# Patient Record
Sex: Female | Born: 1955
Health system: Southern US, Community
[De-identification: ages and names within clinical notes are randomized; demographics above are authoritative.]

## PROBLEM LIST (undated history)

## (undated) DIAGNOSIS — F32A Depression, unspecified: Secondary | ICD-10-CM

## (undated) DIAGNOSIS — F329 Major depressive disorder, single episode, unspecified: Secondary | ICD-10-CM

## (undated) HISTORY — DX: Major depressive disorder, single episode, unspecified: F32.9

## (undated) HISTORY — DX: Depression, unspecified: F32.A

## (undated) HISTORY — PX: OTHER SURGICAL HISTORY: SHX169

## (undated) HISTORY — PX: SPINE SURGERY: SHX786

---

## 1986-12-14 HISTORY — PX: TUBAL LIGATION: SHX77

## 2017-01-21 ENCOUNTER — Encounter: Payer: Self-pay | Admitting: Family

## 2017-01-21 ENCOUNTER — Ambulatory Visit (INDEPENDENT_AMBULATORY_CARE_PROVIDER_SITE_OTHER): Payer: Self-pay | Admitting: Family

## 2017-01-21 DIAGNOSIS — F329 Major depressive disorder, single episode, unspecified: Secondary | ICD-10-CM

## 2017-01-21 DIAGNOSIS — F418 Other specified anxiety disorders: Secondary | ICD-10-CM

## 2017-01-21 DIAGNOSIS — F32A Depression, unspecified: Secondary | ICD-10-CM

## 2017-01-21 DIAGNOSIS — E663 Overweight: Secondary | ICD-10-CM | POA: Insufficient documentation

## 2017-01-21 DIAGNOSIS — F419 Anxiety disorder, unspecified: Principal | ICD-10-CM

## 2017-01-21 MED ORDER — CITALOPRAM HYDROBROMIDE 20 MG PO TABS: 20.0000 mg | ORAL_TABLET | Freq: Every day | ORAL | 1 refills | Status: DC

## 2017-01-21 NOTE — Progress Notes (Signed)
   Subjective:    Patient ID: Catherine [name], female    DOB: February 10, 1956, 61 y.o.   MRN: [id number]  PT presents to the office today to establish care. Pt has a history of depression and has been on Celexa in past. Pt states she moved and was "happy" and stopped taking her medication. Since he has had a job change and is very stressed.  Depression         This is a recurrent problem.  The problem has been rapidly worsening since onset.  Associated symptoms include fatigue, helplessness, hopelessness, irritable, restlessness, decreased interest and sad.     The symptoms are aggravated by work stress and family issues.  Past treatments include nothing.     Review of Systems  Constitutional: Positive for fatigue.  Psychiatric/Behavioral: Positive for depression.  All other systems reviewed and are negative.      Objective:   Physical Exam  Constitutional: She is oriented to person, place, and time. She appears well-developed and well-nourished. She is irritable. No distress.  HENT:  Head: Normocephalic and atraumatic.  Right Ear: External ear normal.  Left Ear: External ear normal.  Nose: Nose normal.  Mouth/Throat: Oropharynx is clear and moist.  Eyes: Pupils are equal, round, and reactive to light.  Neck: Normal range of motion. Neck supple. No thyromegaly present.  Cardiovascular: Normal rate, regular rhythm, normal heart sounds and intact distal pulses.   No murmur heard. Pulmonary/Chest: Effort normal and breath sounds normal. No respiratory distress. She has no wheezes.  Abdominal: Soft. Bowel sounds are normal. She exhibits no distension. There is no tenderness.  Musculoskeletal: Normal range of motion. She exhibits no edema or tenderness.  Neurological: She is alert and oriented to person, place, and time.  Skin: Skin is warm and dry.  Psychiatric: She has a normal mood and affect. Her behavior is normal. Judgment and thought content normal.  Pt tearful and crying  Vitals  reviewed.     BP 138/86   Pulse 77   Temp 97.8 F (36.6 C) (Oral)   Ht 5\' 9"  (1.753 m)   Wt 196 lb (88.9 kg)   BMI 28.94 kg/m      Assessment & Plan:  1. Anxiety and depression -Pt started on Celexa 20 mg today Stress management discussed RTO in 6 week s - citalopram (CELEXA) 20 MG tablet; Take 1 tablet (20 mg total) by mouth daily.  Dispense: 90 tablet; Refill: 1  2. Overweight (BMI 25.0-29.9)    Evelina Dun, FNP

## 2017-01-21 NOTE — Patient Instructions (Signed)
Stress and Stress Management Stress is a normal reaction to life events. It is what you feel when life demands more than you are used to or more than you can handle. Some stress can be useful. For example, the stress reaction can help you catch the last bus of the day, study for a test, or meet a deadline at work. But stress that occurs too often or for too long can cause problems. It can affect your emotional health and interfere with relationships and normal daily activities. Too much stress can weaken your immune system and increase your risk for physical illness. If you already have a medical problem, stress can make it worse. What are the causes? All sorts of life events may cause stress. An event that causes stress for one person may not be stressful for another person. Major life events commonly cause stress. These may be positive or negative. Examples include losing your job, moving into a new home, getting married, having a baby, or losing a loved one. Less obvious life events may also cause stress, especially if they occur day after day or in combination. Examples include working long hours, driving in traffic, caring for children, being in debt, or being in a difficult relationship. What are the signs or symptoms? Stress may cause emotional symptoms including, the following:  Anxiety. This is feeling worried, afraid, on edge, overwhelmed, or out of control.  Anger. This is feeling irritated or impatient.  Depression. This is feeling sad, down, helpless, or guilty.  Difficulty focusing, remembering, or making decisions. Stress may cause physical symptoms, including the following:  Aches and pains. These may affect your head, neck, back, stomach, or other areas of your body.  Tight muscles or clenched jaw.  Low energy or trouble sleeping. Stress may cause unhealthy behaviors, including the following:  Eating to feel better (overeating) or skipping meals.  Sleeping too little, too  much, or both.  Working too much or putting off tasks (procrastination).  Smoking, drinking alcohol, or using drugs to feel better. How is this diagnosed? Stress is diagnosed through an assessment by your health care provider. Your health care provider will ask questions about your symptoms and any stressful life events.Your health care provider will also ask about your medical history and may order blood tests or other tests. Certain medical conditions and medicine can cause physical symptoms similar to stress. Mental illness can cause emotional symptoms and unhealthy behaviors similar to stress. Your health care provider may refer you to a mental health professional for further evaluation. How is this treated? Stress management is the recommended treatment for stress.The goals of stress management are reducing stressful life events and coping with stress in healthy ways. Techniques for reducing stressful life events include the following:  Stress identification. Self-monitor for stress and identify what causes stress for you. These skills may help you to avoid some stressful events.  Time management. Set your priorities, keep a calendar of events, and learn to say "no." These tools can help you avoid making too many commitments. Techniques for coping with stress include the following:  Rethinking the problem. Try to think realistically about stressful events rather than ignoring them or overreacting. Try to find the positives in a stressful situation rather than focusing on the negatives.  Exercise. Physical exercise can release both physical and emotional tension. The key is to find a form of exercise you enjoy and do it regularly.  Relaxation techniques. These relax the body and mind. Examples include yoga,  meditation, tai chi, biofeedback, deep breathing, progressive muscle relaxation, listening to music, being out in nature, journaling, and other hobbies. Again, the key is to find one or  more that you enjoy and can do regularly.  Healthy lifestyle. Eat a balanced diet, get plenty of sleep, and do not smoke. Avoid using alcohol or drugs to relax.  Strong support network. Spend time with family, friends, or other people you enjoy being around.Express your feelings and talk things over with someone you trust. Counseling or talktherapy with a mental health professional may be helpful if you are having difficulty managing stress on your own. Medicine is typically not recommended for the treatment of stress.Talk to your health care provider if you think you need medicine for symptoms of stress. Follow these instructions at home:  Keep all follow-up visits as directed by your health care provider.  Take all medicines as directed by your health care provider. Contact a health care provider if:  Your symptoms get worse or you start having new symptoms.  You feel overwhelmed by your problems and can no longer manage them on your own. Get help right away if:  You feel like hurting yourself or someone else. This information is not intended to replace advice given to you by your health care provider. Make sure you discuss any questions you have with your health care provider. Document Released: 05/26/2001 Document Revised: 05/07/2016 Document Reviewed: 07/25/2013 Elsevier Interactive Patient Education  2017 [employer].

## 2017-02-24 ENCOUNTER — Ambulatory Visit (INDEPENDENT_AMBULATORY_CARE_PROVIDER_SITE_OTHER): Payer: Self-pay | Admitting: Family Medicine

## 2017-02-24 ENCOUNTER — Encounter: Payer: Self-pay | Admitting: Family Medicine

## 2017-02-24 VITALS — BP 138/74 | HR 63 | Temp 97.5°F | Ht 69.0 in | Wt 195.1 lb

## 2017-02-24 DIAGNOSIS — J0110 Acute frontal sinusitis, unspecified: Secondary | ICD-10-CM

## 2017-02-24 MED ORDER — AZITHROMYCIN 250 MG PO TABS: ORAL_TABLET | ORAL | 0 refills | Status: DC

## 2017-02-24 NOTE — Progress Notes (Signed)
BP 138/74   Pulse 63   Temp 97.5 F (36.4 C) (Oral)   Ht 5\' 9"  (1.753 m)   Wt 195 lb 2 oz (88.5 kg)   BMI 28.81 kg/m    Subjective:    Patient ID: Catherine [name], female    DOB: 1956-06-21, 61 y.o.   MRN: [id number]  HPI: Catherine [name] is a 61 y.o. female presenting on 02/24/2017 for Sinusitis (sinus congestion, sore throat, left ear pain; began 1 week ago; has tried Dayquil, Nyquil and Sudafed) and Cough   HPI Sinus congestion and sore throat and cough Patient has been having sinus congestion and sore throat and cough that has been going on for the past 6 days. She also has developed some chills overnight and some left ear pain over the past couple days. She has been using DayQuil and NyQuil and just last night started using Sudafed. She denies any sick contacts that she knows of. She denies any shortness of breath or wheezing. She does not feel like it's worsening or improving over the past 6 days.  Relevant past medical, surgical, family and social history reviewed and updated as indicated. Interim medical history since our last visit reviewed. Allergies and medications reviewed and updated.  Review of Systems  Constitutional: Positive for chills. Negative for fever.  HENT: Positive for congestion, ear pain, postnasal drip, rhinorrhea, sinus pressure, sneezing and sore throat. Negative for ear discharge.   Eyes: Negative for pain, redness and visual disturbance.  Respiratory: Positive for cough. Negative for chest tightness, shortness of breath and wheezing.   Cardiovascular: Negative for chest pain and leg swelling.  Genitourinary: Negative for difficulty urinating and dysuria.  Musculoskeletal: Negative for back pain and gait problem.  Skin: Negative for rash.  Neurological: Negative for light-headedness and headaches.  Psychiatric/Behavioral: Negative for agitation and behavioral problems.  All other systems reviewed and are negative.   Per HPI unless specifically indicated  above        Objective:    BP 138/74   Pulse 63   Temp 97.5 F (36.4 C) (Oral)   Ht 5\' 9"  (1.753 m)   Wt 195 lb 2 oz (88.5 kg)   BMI 28.81 kg/m   Wt Readings from Last 3 Encounters:  02/24/17 195 lb 2 oz (88.5 kg)  01/21/17 196 lb (88.9 kg)    Physical Exam  Constitutional: She is oriented to person, place, and time. She appears well-developed and well-nourished. No distress.  HENT:  Right Ear: Tympanic membrane, external ear and ear canal normal.  Left Ear: Tympanic membrane, external ear and ear canal normal.  Nose: Mucosal edema and rhinorrhea present. No epistaxis. Right sinus exhibits frontal sinus tenderness. Right sinus exhibits no maxillary sinus tenderness. Left sinus exhibits frontal sinus tenderness. Left sinus exhibits no maxillary sinus tenderness.  Mouth/Throat: Uvula is midline and mucous membranes are normal. Posterior oropharyngeal edema and posterior oropharyngeal erythema present. No oropharyngeal exudate or tonsillar abscesses.  Eyes: Conjunctivae are normal.  Neck: Neck supple.  Cardiovascular: Normal rate, regular rhythm, normal heart sounds and intact distal pulses.   No murmur heard. Pulmonary/Chest: Effort normal and breath sounds normal. No respiratory distress. She has no wheezes. She has no rales.  Musculoskeletal: Normal range of motion. She exhibits no edema or tenderness.  Lymphadenopathy:    She has no cervical adenopathy.  Neurological: She is alert and oriented to person, place, and time. Coordination normal.  Skin: Skin is warm and dry. No rash noted. She is not  diaphoretic.  Psychiatric: She has a normal mood and affect. Her behavior is normal.  Vitals reviewed.   No results found for this or any previous visit.    Assessment & Plan:   Problem List Items Addressed This Visit    None    Visit Diagnoses    Acute non-recurrent frontal sinusitis    -  Primary   Recommended for patient to use Flonase and Mucinex and an antihistamine and  nasal saline   Relevant Medications   azithromycin (ZITHROMAX) 250 MG tablet       Follow up plan: Return if symptoms worsen or fail to improve.  Counseling provided for all of the vaccine components No orders of the defined types were placed in this encounter.   Caryl Pina, MD [facility] 02/24/2017, 12:04 PM

## 2017-03-04 ENCOUNTER — Ambulatory Visit (INDEPENDENT_AMBULATORY_CARE_PROVIDER_SITE_OTHER): Payer: Self-pay | Admitting: Family

## 2017-03-04 ENCOUNTER — Encounter: Payer: Self-pay | Admitting: Family

## 2017-03-04 VITALS — BP 134/87 | HR 60 | Temp 97.2°F | Ht 69.0 in | Wt 194.4 lb

## 2017-03-04 DIAGNOSIS — F32A Depression, unspecified: Secondary | ICD-10-CM

## 2017-03-04 DIAGNOSIS — F329 Major depressive disorder, single episode, unspecified: Secondary | ICD-10-CM

## 2017-03-04 DIAGNOSIS — F418 Other specified anxiety disorders: Secondary | ICD-10-CM

## 2017-03-04 DIAGNOSIS — F419 Anxiety disorder, unspecified: Principal | ICD-10-CM

## 2017-03-04 MED ORDER — CITALOPRAM HYDROBROMIDE 40 MG PO TABS: 40.0000 mg | ORAL_TABLET | Freq: Every day | ORAL | 5 refills | Status: DC

## 2017-03-04 NOTE — Patient Instructions (Signed)
Major Depressive Disorder, Adult Major depressive disorder (MDD) is a mental health condition. It may also be called clinical depression or unipolar depression. MDD usually causes feelings of sadness, hopelessness, or helplessness. MDD can also cause physical symptoms. It can interfere with work, school, relationships, and other everyday activities. MDD may be mild, moderate, or severe. It may occur once (single episode major depressive disorder) or it may occur multiple times (recurrent major depressive disorder). What are the causes? The exact cause of this condition is not known. MDD is most likely caused by a combination of things, which may include:  Genetic factors. These are traits that are passed along from parent to child.  Individual factors. Your personality, your behavior, and the way you handle your thoughts and feelings may contribute to MDD. This includes personality traits and behaviors learned from others.  Physical factors, such as: ? Differences in the part of your brain that controls emotion. This part of your brain may be different than it is in people who do not have MDD. ? Long-term (chronic) medical or psychiatric illnesses.  Social factors. Traumatic experiences or major life changes may play a role in the development of MDD.  What increases the risk? This condition is more likely to develop in women. The following factors may also make you more likely to develop MDD:  A family history of depression.  Troubled family relationships.  Abnormally low levels of certain brain chemicals.  Traumatic events in childhood, especially abuse or the loss of a parent.  Being under a lot of stress, or long-term stress, especially from upsetting life experiences or losses.  A history of: ? Chronic physical illness. ? Other mental health disorders. ? Substance abuse.  Poor living conditions.  Experiencing social exclusion or discrimination on a regular basis.  What are  the signs or symptoms? The main symptoms of MDD typically include:  Constant depressed or irritable mood.  Loss of interest in things and activities.  MDD symptoms may also include:  Sleeping or eating too much or too little.  Unexplained weight change.  Fatigue or low energy.  Feelings of worthlessness or guilt.  Difficulty thinking clearly or making decisions.  Thoughts of suicide or of harming others.  Physical agitation or weakness.  Isolation.  Severe cases of MDD may also occur with other symptoms, such as:  Delusions or hallucinations, in which you imagine things that are not real (psychotic depression).  Low-level depression that lasts at least a year (chronic depression or persistent depressive disorder).  Extreme sadness and hopelessness (melancholic depression).  Trouble speaking and moving (catatonic depression).  How is this diagnosed? This condition may be diagnosed based on:  Your symptoms.  Your medical history, including your mental health history. This may involve tests to evaluate your mental health. You may be asked questions about your lifestyle, including any drug and alcohol use, and how long you have had symptoms of MDD.  A physical exam.  Blood tests to rule out other conditions.  You must have a depressed mood and at least four other MDD symptoms most of the day, nearly every day in the same 2-week timeframe before your health care provider can confirm a diagnosis of MDD. How is this treated? This condition is usually treated by mental health professionals, such as psychologists, psychiatrists, and clinical social workers. You may need more than one type of treatment. Treatment may include:  Psychotherapy. This is also called talk therapy or counseling. Types of psychotherapy include: ? Cognitive behavioral   therapy (CBT). This type of therapy teaches you to recognize unhealthy feelings, thoughts, and behaviors, and replace them with  positive thoughts and actions. ? Interpersonal therapy (IPT). This helps you to improve the way you relate to and communicate with others. ? Family therapy. This treatment includes members of your family.  Medicine to treat anxiety and depression, or to help you control certain emotions and behaviors.  Lifestyle changes, such as: ? Limiting alcohol and drug use. ? Exercising regularly. ? Getting plenty of sleep. ? Making healthy eating choices. ? Spending more time outdoors.  Treatments involving stimulation of the brain can be used in situations with extremely severe symptoms, or when medicine or other therapies do not work over time. These treatments include electroconvulsive therapy, transcranial magnetic stimulation, and vagal nerve stimulation. Follow these instructions at home: Activity  Return to your normal activities as told by your health care provider.  Exercise regularly and spend time outdoors as told by your health care provider. General instructions  Take over-the-counter and prescription medicines only as told by your health care provider.  Do not drink alcohol. If you drink alcohol, limit your alcohol intake to no more than 1 drink a day for nonpregnant women and 2 drinks a day for men. One drink equals 12 oz of beer, 5 oz of wine, or 1 oz of hard liquor. Alcohol can affect any antidepressant medicines you are taking. Talk to your health care provider about your alcohol use.  Eat a healthy diet and get plenty of sleep.  Find activities that you enjoy doing, and make time to do them.  Consider joining a support group. Your health care provider may be able to recommend a support group.  Keep all follow-up visits as told by your health care provider. This is important. Where to find more information: National Alliance on Mental Illness  www.nami.org  [place]. National Institute of Mental Health  www.nimh.nih.gov  National Suicide Prevention  Lifeline  1-800-273-TALK (8255). This is free, 24-hour help.  Contact a health care provider if:  Your symptoms get worse.  You develop new symptoms. Get help right away if:  You self-harm.  You have serious thoughts about hurting yourself or others.  You see, hear, taste, smell, or feel things that are not present (hallucinate). This information is not intended to replace advice given to you by your health care provider. Make sure you discuss any questions you have with your health care provider. Document Released: 03/27/2013 Document Revised: 08/06/2016 Document Reviewed: 06/10/2016 Elsevier Interactive Patient Education  2017 [employer]  

## 2017-03-04 NOTE — Progress Notes (Signed)
   Subjective:    Patient ID: Catherine [name], female    DOB: Sep 01, 1956, 61 y.o.   MRN: [id number]  Pt presents to the office today to recheck GAD and Depression. Pt was started on Celexa 20 mg. PT reports her anxiety is improved, but still does not feel like she does not want to get off the couch.  Anxiety  Presents for follow-up visit. Symptoms include decreased concentration, depressed mood, excessive worry, insomnia, irritability, nervous/anxious behavior and restlessness. Patient reports no panic or suicidal ideas. Symptoms occur most days. The quality of sleep is fair.    Depression         Associated symptoms include decreased concentration, insomnia, irritable, restlessness and decreased interest.  Associated symptoms include not sad and no suicidal ideas.  Past treatments include SSRIs - Selective serotonin reuptake inhibitors.  Past medical history includes anxiety.       Review of Systems  Constitutional: Positive for irritability.  Psychiatric/Behavioral: Positive for decreased concentration and depression. Negative for suicidal ideas. The patient is nervous/anxious and has insomnia.   All other systems reviewed and are negative.      Objective:   Physical Exam  Constitutional: She is oriented to person, place, and time. She appears well-developed and well-nourished. She is irritable. No distress.  HENT:  Head: Normocephalic.  Eyes: Pupils are equal, round, and reactive to light.  Neck: Normal range of motion. Neck supple. No thyromegaly present.  Cardiovascular: Normal rate, regular rhythm, normal heart sounds and intact distal pulses.   No murmur heard. Pulmonary/Chest: Effort normal and breath sounds normal. No respiratory distress. She has no wheezes.  Abdominal: Soft. Bowel sounds are normal. She exhibits no distension. There is no tenderness.  Musculoskeletal: Normal range of motion. She exhibits no edema or tenderness.  Neurological: She is alert and oriented to  person, place, and time.  Skin: Skin is warm and dry.  Psychiatric: She has a normal mood and affect. Her behavior is normal. Judgment and thought content normal.  Vitals reviewed.    BP 134/87   Pulse 60   Temp 97.2 F (36.2 C) (Oral)   Ht 5\' 9"  (1.753 m)   Wt 194 lb 6.4 oz (88.2 kg)   BMI 28.71 kg/m      Assessment & Plan:  1. Anxiety and depression -Celexa increased to 40 mg from 20 mg Stress management discussed RTO in 6 weeks, discussed importance of scheduling CPE for bloodwork  - citalopram (CELEXA) 40 MG tablet; Take 1 tablet (40 mg total) by mouth daily.  Dispense: 30 tablet; Refill: [place], FNP

## 2017-04-15 ENCOUNTER — Encounter: Payer: Self-pay | Admitting: Family

## 2017-04-15 ENCOUNTER — Ambulatory Visit (INDEPENDENT_AMBULATORY_CARE_PROVIDER_SITE_OTHER): Payer: Self-pay | Admitting: Family

## 2017-04-15 VITALS — BP 124/74 | HR 53 | Temp 97.9°F | Ht 69.0 in | Wt 195.6 lb

## 2017-04-15 DIAGNOSIS — F32A Depression, unspecified: Secondary | ICD-10-CM

## 2017-04-15 DIAGNOSIS — F419 Anxiety disorder, unspecified: Secondary | ICD-10-CM

## 2017-04-15 DIAGNOSIS — F329 Major depressive disorder, single episode, unspecified: Secondary | ICD-10-CM

## 2017-04-15 MED ORDER — CITALOPRAM HYDROBROMIDE 40 MG PO TABS: 40.0000 mg | ORAL_TABLET | Freq: Every day | ORAL | 3 refills | Status: DC

## 2017-04-15 NOTE — Progress Notes (Signed)
   Subjective:    Patient ID: Catherine [name], female    DOB: Apr 22, 1956, 62 y.o.   MRN: [id number]  Pt presents to the office today to recheck GAD. PT currently taking celexa 40 mg daily and reports doing great.  Anxiety  Presents for follow-up visit. Symptoms include excessive worry and nervous/anxious behavior. Patient reports no depressed mood or irritability. Symptoms occur occasionally. The quality of sleep is good.        Review of Systems  Constitutional: Negative for irritability.  Psychiatric/Behavioral: The patient is nervous/anxious.   All other systems reviewed and are negative.      Objective:   Physical Exam  Constitutional: She is oriented to person, place, and time. She appears well-developed and well-nourished. No distress.  HENT:  Head: Normocephalic and atraumatic.  Eyes: Pupils are equal, round, and reactive to light.  Neck: Normal range of motion. Neck supple. No thyromegaly present.  Cardiovascular: Normal rate, regular rhythm, normal heart sounds and intact distal pulses.   No murmur heard. Pulmonary/Chest: Effort normal and breath sounds normal. No respiratory distress. She has no wheezes.  Abdominal: Soft. Bowel sounds are normal. She exhibits no distension. There is no tenderness.  Musculoskeletal: Normal range of motion. She exhibits no edema or tenderness.  Neurological: She is alert and oriented to person, place, and time.  Skin: Skin is warm and dry.  Psychiatric: She has a normal mood and affect. Her behavior is normal. Judgment and thought content normal.  Vitals reviewed.   BP 124/74   Pulse (!) 53   Temp 97.9 F (36.6 C) (Oral)   Ht 5\' 9"  (1.753 m)   Wt 195 lb 9.6 oz (88.7 kg)   BMI 28.89 kg/m        Assessment & Plan:  1. Anxiety and depression -Stress management discuss -Continue Celexa 40 mg  RTO in 1 year - citalopram (CELEXA) 40 MG tablet; Take 1 tablet (40 mg total) by mouth daily.  Dispense: 90 tablet; Refill:  [place], FNP

## 2017-04-15 NOTE — Patient Instructions (Signed)
Stress and Stress Management Stress is a normal reaction to life events. It is what you feel when life demands more than you are used to or more than you can handle. Some stress can be useful. For example, the stress reaction can help you catch the last bus of the day, study for a test, or meet a deadline at work. But stress that occurs too often or for too long can cause problems. It can affect your emotional health and interfere with relationships and normal daily activities. Too much stress can weaken your immune system and increase your risk for physical illness. If you already have a medical problem, stress can make it worse. What are the causes? All sorts of life events may cause stress. An event that causes stress for one person may not be stressful for another person. Major life events commonly cause stress. These may be positive or negative. Examples include losing your job, moving into a new home, getting married, having a baby, or losing a loved one. Less obvious life events may also cause stress, especially if they occur day after day or in combination. Examples include working long hours, driving in traffic, caring for children, being in debt, or being in a difficult relationship. What are the signs or symptoms? Stress may cause emotional symptoms including, the following:  Anxiety. This is feeling worried, afraid, on edge, overwhelmed, or out of control.  Anger. This is feeling irritated or impatient.  Depression. This is feeling sad, down, helpless, or guilty.  Difficulty focusing, remembering, or making decisions. Stress may cause physical symptoms, including the following:  Aches and pains. These may affect your head, neck, back, stomach, or other areas of your body.  Tight muscles or clenched jaw.  Low energy or trouble sleeping. Stress may cause unhealthy behaviors, including the following:  Eating to feel better (overeating) or skipping meals.  Sleeping too little, too  much, or both.  Working too much or putting off tasks (procrastination).  Smoking, drinking alcohol, or using drugs to feel better. How is this diagnosed? Stress is diagnosed through an assessment by your health care provider. Your health care provider will ask questions about your symptoms and any stressful life events.Your health care provider will also ask about your medical history and may order blood tests or other tests. Certain medical conditions and medicine can cause physical symptoms similar to stress. Mental illness can cause emotional symptoms and unhealthy behaviors similar to stress. Your health care provider may refer you to a mental health professional for further evaluation. How is this treated? Stress management is the recommended treatment for stress.The goals of stress management are reducing stressful life events and coping with stress in healthy ways. Techniques for reducing stressful life events include the following:  Stress identification. Self-monitor for stress and identify what causes stress for you. These skills may help you to avoid some stressful events.  Time management. Set your priorities, keep a calendar of events, and learn to say "no." These tools can help you avoid making too many commitments. Techniques for coping with stress include the following:  Rethinking the problem. Try to think realistically about stressful events rather than ignoring them or overreacting. Try to find the positives in a stressful situation rather than focusing on the negatives.  Exercise. Physical exercise can release both physical and emotional tension. The key is to find a form of exercise you enjoy and do it regularly.  Relaxation techniques. These relax the body and mind. Examples include yoga,  meditation, tai chi, biofeedback, deep breathing, progressive muscle relaxation, listening to music, being out in nature, journaling, and other hobbies. Again, the key is to find one or  more that you enjoy and can do regularly.  Healthy lifestyle. Eat a balanced diet, get plenty of sleep, and do not smoke. Avoid using alcohol or drugs to relax.  Strong support network. Spend time with family, friends, or other people you enjoy being around.Express your feelings and talk things over with someone you trust. Counseling or talktherapy with a mental health professional may be helpful if you are having difficulty managing stress on your own. Medicine is typically not recommended for the treatment of stress.Talk to your health care provider if you think you need medicine for symptoms of stress. Follow these instructions at home:  Keep all follow-up visits as directed by your health care provider.  Take all medicines as directed by your health care provider. Contact a health care provider if:  Your symptoms get worse or you start having new symptoms.  You feel overwhelmed by your problems and can no longer manage them on your own. Get help right away if:  You feel like hurting yourself or someone else. This information is not intended to replace advice given to you by your health care provider. Make sure you discuss any questions you have with your health care provider. Document Released: 05/26/2001 Document Revised: 05/07/2016 Document Reviewed: 07/25/2013 Elsevier Interactive Patient Education  2017 [employer].

## 2018-05-03 ENCOUNTER — Telehealth: Payer: Self-pay | Admitting: Family

## 2018-07-12 ENCOUNTER — Ambulatory Visit (INDEPENDENT_AMBULATORY_CARE_PROVIDER_SITE_OTHER): Payer: Self-pay | Admitting: Family

## 2018-07-12 ENCOUNTER — Encounter: Payer: Self-pay | Admitting: Family

## 2018-07-12 VITALS — BP 125/80 | HR 61 | Temp 97.0°F | Ht 69.0 in | Wt 198.8 lb

## 2018-07-12 DIAGNOSIS — F419 Anxiety disorder, unspecified: Secondary | ICD-10-CM

## 2018-07-12 DIAGNOSIS — E663 Overweight: Secondary | ICD-10-CM

## 2018-07-12 DIAGNOSIS — F329 Major depressive disorder, single episode, unspecified: Secondary | ICD-10-CM

## 2018-07-12 LAB — CMP14+EGFR
ALBUMIN: 4 g/dL (ref 3.6–4.8)
ALT: 49 IU/L — ABNORMAL HIGH (ref 0–32)
AST: 32 IU/L (ref 0–40)
Albumin/Globulin Ratio: 1.5 (ref 1.2–2.2)
Alkaline Phosphatase: 92 IU/L (ref 39–117)
BUN / CREAT RATIO: 11 — AB (ref 12–28)
BUN: 10 mg/dL (ref 8–27)
Bilirubin Total: 1.2 mg/dL (ref 0.0–1.2)
CHLORIDE: 102 mmol/L (ref 96–106)
CO2: 23 mmol/L (ref 20–29)
CREATININE: 0.88 mg/dL (ref 0.57–1.00)
Calcium: 9.4 mg/dL (ref 8.7–10.3)
GFR calc non Af Amer: 70 mL/min/{1.73_m2} (ref >59)
GFR, EST AFRICAN AMERICAN: 81 mL/min/{1.73_m2} (ref >59)
GLUCOSE: 97 mg/dL (ref 65–99)
Globulin, Total: 2.6 g/dL (ref 1.5–4.5)
POTASSIUM: 4 mmol/L (ref 3.5–5.2)
Sodium: 140 mmol/L (ref 134–144)
TOTAL PROTEIN: 6.6 g/dL (ref 6.0–8.5)

## 2018-07-12 MED ORDER — BUPROPION HCL ER (XL) 150 MG PO TB24: 150.0000 mg | ORAL_TABLET | Freq: Every day | ORAL | 1 refills | Status: DC

## 2018-07-12 MED ORDER — CITALOPRAM HYDROBROMIDE 40 MG PO TABS: 40.0000 mg | ORAL_TABLET | Freq: Every day | ORAL | 3 refills | Status: DC

## 2018-07-12 NOTE — Progress Notes (Signed)
   Subjective:    Patient ID: Catherine [name], female    DOB: 1956-03-01, 62 y.o.   MRN: [id number]  Chief Complaint  Patient presents with  . depression and anxiety    refills   Pt presents to the office today for chronic follow up. Pt states her mother died in 2023-02-04 and is trying to deal with this. States she had to care for mother for about 6 weeks.  Anxiety  Presents for follow-up visit. Symptoms include excessive worry and nervous/anxious behavior. Patient reports no dizziness or irritability. The severity of symptoms is moderate. The quality of sleep is good.    Depression         This is a chronic problem.  The current episode started more than 1 year ago.   The onset quality is gradual.   The problem occurs intermittently.  The problem has been waxing and waning since onset.  Associated symptoms include irritable and sad.  Associated symptoms include no helplessness, no hopelessness and no decreased interest.  Past treatments include SSRIs - Selective serotonin reuptake inhibitors.  Past medical history includes anxiety.       Review of Systems  Constitutional: Negative for irritability.  Neurological: Negative for dizziness.  Psychiatric/Behavioral: Positive for depression. The patient is nervous/anxious.   All other systems reviewed and are negative.      Objective:   Physical Exam  Constitutional: She is oriented to person, place, and time. She appears well-developed and well-nourished. She is irritable. No distress.  HENT:  Head: Normocephalic and atraumatic.  Right Ear: External ear normal.  Left Ear: External ear normal.  Mouth/Throat: Oropharynx is clear and moist.  Eyes: Pupils are equal, round, and reactive to light.  Neck: Normal range of motion. Neck supple. No thyromegaly present.  Cardiovascular: Normal rate, regular rhythm, normal heart sounds and intact distal pulses.  No murmur heard. Pulmonary/Chest: Effort normal and breath sounds normal. No  respiratory distress. She has no wheezes.  Abdominal: Soft. Bowel sounds are normal. She exhibits no distension. There is no tenderness.  Musculoskeletal: Normal range of motion. She exhibits no edema or tenderness.  Neurological: She is alert and oriented to person, place, and time. She has normal reflexes. No cranial nerve deficit.  Skin: Skin is warm and dry.  Psychiatric: She has a normal mood and affect. Her behavior is normal. Judgment and thought content normal.  Tearful   Vitals reviewed.     BP 125/80   Pulse 61   Temp (!) 97 F (36.1 C) (Oral)   Ht '5\' 9"'$  (1.753 m)   Wt 198 lb 12.8 oz (90.2 kg)   BMI 29.36 kg/m      Assessment & Plan:  Catherine [name] comes in today with chief complaint of depression and anxiety (refills)   Diagnosis and orders addressed:  1. Anxiety and depression Will add Wellbutrin 150 mg today Stress management discussed  RTO in 1 year or if symptoms do not improve - citalopram (CELEXA) 40 MG tablet; Take 1 tablet (40 mg total) by mouth daily.  Dispense: 90 tablet; Refill: 3 - buPROPion (WELLBUTRIN XL) 150 MG 24 hr tablet; Take 1 tablet (150 mg total) by mouth daily.  Dispense: 90 tablet; Refill: 1 - CMP14+EGFR  2. Overweight (BMI 25.0-29.9) - CMP14+EGFR   Labs pending Health Maintenance reviewed Diet and exercise encouraged  Follow up plan: 1 year   Evelina Dun, FNP

## 2018-07-12 NOTE — Patient Instructions (Signed)
Living With Depression Everyone experiences occasional disappointment, sadness, and loss in their lives. When you are feeling down, blue, or sad for at least 2 weeks in a row, it may mean that you have depression. Depression can affect your thoughts and feelings, relationships, daily activities, and physical health. It is caused by changes in the way your brain functions. If you receive a diagnosis of depression, your health care provider will tell you which type of depression you have and what treatment options are available to you. If you are living with depression, there are ways to help you recover from it and also ways to prevent it from coming back. How to cope with lifestyle changes Coping with stress Stress is your body's reaction to life changes and events, both good and bad. Stressful situations may include:  Getting married.  The death of a spouse.  Losing a job.  Retiring.  Having a baby.  Stress can last just a few hours or it can be ongoing. Stress can play a major role in depression, so it is important to learn both how to cope with stress and how to think about it differently. Talk with your health care provider or a counselor if you would like to learn more about stress reduction. He or she may suggest some stress reduction techniques, such as:  Music therapy. This can include creating music or listening to music. Choose music that you enjoy and that inspires you.  Mindfulness-based meditation. This kind of meditation can be done while sitting or walking. It involves being aware of your normal breaths, rather than trying to control your breathing.  Centering prayer. This is a kind of meditation that involves focusing on a spiritual word or phrase. Choose a word, phrase, or sacred image that is meaningful to you and that brings you peace.  Deep breathing. To do this, expand your stomach and inhale slowly through your nose. Hold your breath for 3-5 seconds, then exhale  slowly, allowing your stomach muscles to relax.  Muscle relaxation. This involves intentionally tensing muscles then relaxing them.  Choose a stress reduction technique that fits your lifestyle and personality. Stress reduction techniques take time and practice to develop. Set aside 5-15 minutes a day to do them. Therapists can offer training in these techniques. The training may be covered by some insurance plans. Other things you can do to manage stress include:  Keeping a stress diary. This can help you learn what triggers your stress and ways to control your response.  Understanding what your limits are and saying no to requests or events that lead to a schedule that is too full.  Thinking about how you respond to certain situations. You may not be able to control everything, but you can control how you react.  Adding humor to your life by watching funny films or TV shows.  Making time for activities that help you relax and not feeling guilty about spending your time this way.  Medicines Your health care provider may suggest certain medicines if he or she feels that they will help improve your condition. Avoid using alcohol and other substances that may prevent your medicines from working properly (may interact). It is also important to:  Talk with your pharmacist or health care provider about all the medicines that you take, their possible side effects, and what medicines are safe to take together.  Make it your goal to take part in all treatment decisions (shared decision-making). This includes giving input on the side   effects of medicines. It is best if shared decision-making with your health care provider is part of your total treatment plan.  If your health care provider prescribes a medicine, you may not notice the full benefits of it for 4-8 weeks. Most people who are treated for depression need to be on medicine for at least 6-12 months after they feel better. If you are taking  medicines as part of your treatment, do not stop taking medicines without first talking to your health care provider. You may need to have the medicine slowly decreased (tapered) over time to decrease the risk of harmful side effects. Relationships Your health care provider may suggest family therapy along with individual therapy and drug therapy. While there may not be family problems that are causing you to feel depressed, it is still important to make sure your family learns as much as they can about your mental health. Having your family's support can help make your treatment successful. How to recognize changes in your condition Everyone has a different response to treatment for depression. Recovery from major depression happens when you have not had signs of major depression for two months. This may mean that you will start to:  Have more interest in doing activities.  Feel less hopeless than you did 2 months ago.  Have more energy.  Overeat less often, or have better or improving appetite.  Have better concentration.  Your health care provider will work with you to decide the next steps in your recovery. It is also important to recognize when your condition is getting worse. Watch for these signs:  Having fatigue or low energy.  Eating too much or too little.  Sleeping too much or too little.  Feeling restless, agitated, or hopeless.  Having trouble concentrating or making decisions.  Having unexplained physical complaints.  Feeling irritable, angry, or aggressive.  Get help as soon as you or your family members notice these symptoms coming back. How to get support and help from others How to talk with friends and family members about your condition Talking to friends and family members about your condition can provide you with one way to get support and guidance. Reach out to trusted friends or family members, explain your symptoms to them, and let them know that you are  working with a health care provider to treat your depression. Financial resources Not all insurance plans cover mental health care, so it is important to check with your insurance carrier. If paying for co-pays or counseling services is a problem, search for a local or county mental health care center. They may be able to offer public mental health care services at low or no cost when you are not able to see a private health care provider. If you are taking medicine for depression, you may be able to get the generic form, which may be less expensive. Some makers of prescription medicines also offer help to patients who cannot afford the medicines they need. Follow these instructions at home:  Get the right amount and quality of sleep.  Cut down on using caffeine, tobacco, alcohol, and other potentially harmful substances.  Try to exercise, such as walking or lifting small weights.  Take over-the-counter and prescription medicines only as told by your health care provider.  Eat a healthy diet that includes plenty of vegetables, fruits, whole grains, low-fat dairy products, and lean protein. Do not eat a lot of foods that are high in solid fats, added sugars, or salt.    Keep all follow-up visits as told by your health care provider. This is important. Contact a health care provider if:  You stop taking your antidepressant medicines, and you have any of these symptoms: ? Nausea. ? Headache. ? Feeling lightheaded. ? Chills and body aches. ? Not being able to sleep (insomnia).  You or your friends and family think your depression is getting worse. Get help right away if:  You have thoughts of hurting yourself or others. If you ever feel like you may hurt yourself or others, or have thoughts about taking your own life, get help right away. You can go to your nearest emergency department or call:  Your local emergency services (911 in the [place]  A suicide crisis helpline, such as the  National Suicide Prevention Lifeline at 1-800-273-8255. This is open 24-hours a day.  Summary  If you are living with depression, there are ways to help you recover from it and also ways to prevent it from coming back.  Work with your health care team to create a management plan that includes counseling, stress management techniques, and healthy lifestyle habits. This information is not intended to replace advice given to you by your health care provider. Make sure you discuss any questions you have with your health care provider. Document Released: 11/02/2016 Document Revised: 11/02/2016 Document Reviewed: 11/02/2016 Elsevier Interactive Patient Education  2018 [employer]  

## 2018-08-17 ENCOUNTER — Encounter: Payer: Self-pay | Admitting: Physician Assistant

## 2018-08-17 ENCOUNTER — Ambulatory Visit: Payer: Self-pay | Admitting: Physician Assistant

## 2018-08-17 ENCOUNTER — Encounter (INDEPENDENT_AMBULATORY_CARE_PROVIDER_SITE_OTHER): Payer: Self-pay

## 2018-08-17 VITALS — BP 122/79 | HR 73 | Temp 97.5°F | Ht 69.0 in | Wt 196.2 lb

## 2018-08-17 DIAGNOSIS — G8929 Other chronic pain: Secondary | ICD-10-CM

## 2018-08-17 DIAGNOSIS — M25519 Pain in unspecified shoulder: Secondary | ICD-10-CM

## 2018-08-17 MED ORDER — CYCLOBENZAPRINE HCL 10 MG PO TABS: 10.0000 mg | ORAL_TABLET | Freq: Three times a day (TID) | ORAL | 0 refills | Status: DC | PRN

## 2018-08-17 MED ORDER — PREDNISONE 10 MG (21) PO TBPK: ORAL_TABLET | ORAL | 0 refills | Status: DC

## 2018-08-17 NOTE — Patient Instructions (Signed)
Neck Exercises Neck exercises can be important for many reasons:  They can help you to improve and maintain flexibility in your neck. This can be especially important as you age.  They can help to make your neck stronger. This can make movement easier.  They can reduce or prevent neck pain.  They may help your upper back.  Ask your health care provider which neck exercises would be best for you. Exercises Neck Press Repeat this exercise 10 times. Do it first thing in the morning and right before bed or as told by your health care provider. 1. Lie on your back on a firm bed or on the floor with a pillow under your head. 2. Use your neck muscles to push your head down on the pillow and straighten your spine. 3. Hold the position as well as you can. Keep your head facing up and your chin tucked. 4. Slowly count to 5 while holding this position. 5. Relax for a few seconds. Then repeat.  Isometric Strengthening Do a full set of these exercises 2 times a day or as told by your health care provider. 1. Sit in a supportive chair and place your hand on your forehead. 2. Push forward with your head and neck while pushing back with your hand. Hold for 10 seconds. 3. Relax. Then repeat the exercise 3 times. 4. Next, do thesequence again, this time putting your hand against the back of your head. Use your head and neck to push backward against the hand pressure. 5. Finally, do the same exercise on either side of your head, pushing sideways against the pressure of your hand.  Prone Head Lifts Repeat this exercise 5 times. Do this 2 times a day or as told by your health care provider. 1. Lie face-down, resting on your elbows so that your chest and upper back are raised. 2. Start with your head facing downward, near your chest. Position your chin either on or near your chest. 3. Slowly lift your head upward. Lift until you are looking straight ahead. Then continue lifting your head as far back as  you can stretch. 4. Hold your head up for 5 seconds. Then slowly lower it to your starting position.  Supine Head Lifts Repeat this exercise 8-10 times. Do this 2 times a day or as told by your health care provider. 1. Lie on your back, bending your knees to point to the ceiling and keeping your feet flat on the floor. 2. Lift your head slowly off the floor, raising your chin toward your chest. 3. Hold for 5 seconds. 4. Relax and repeat.  Scapular Retraction Repeat this exercise 5 times. Do this 2 times a day or as told by your health care provider. 1. Stand with your arms at your sides. Look straight ahead. 2. Slowly pull both shoulders backward and downward until you feel a stretch between your shoulder blades in your upper back. 3. Hold for 10-30 seconds. 4. Relax and repeat.  Contact a health care provider if:  Your neck pain or discomfort gets much worse when you do an exercise.  Your neck pain or discomfort does not improve within 2 hours after you exercise. If you have any of these problems, stop exercising right away. Do not do the exercises again unless your health care provider says that you can. Get help right away if:  You develop sudden, severe neck pain. If this happens, stop exercising right away. Do not do the exercises again unless your   health care provider says that you can. Exercises Neck Stretch  Repeat this exercise 3-5 times. 1. Do this exercise while standing or while sitting in a chair. 2. Place your feet flat on the floor, shoulder-width apart. 3. Slowly turn your head to the right. Turn it all the way to the right so you can look over your right shoulder. Do not tilt or tip your head. 4. Hold this position for 10-30 seconds. 5. Slowly turn your head to the left, to look over your left shoulder. 6. Hold this position for 10-30 seconds.  Neck Retraction Repeat this exercise 8-10 times. Do this 3-4 times a day or as told by your health care  provider. 1. Do this exercise while standing or while sitting in a sturdy chair. 2. Look straight ahead. Do not bend your neck. 3. Use your fingers to push your chin backward. Do not bend your neck for this movement. Continue to face straight ahead. If you are doing the exercise properly, you will feel a slight sensation in your throat and a stretch at the back of your neck. 4. Hold the stretch for 1-2 seconds. Relax and repeat.  This information is not intended to replace advice given to you by your health care provider. Make sure you discuss any questions you have with your health care provider. Document Released: 11/11/2015 Document Revised: 05/07/2016 Document Reviewed: 06/10/2015 Elsevier Interactive Patient Education  2018 [employer] Inc[employer]  

## 2018-08-20 NOTE — Progress Notes (Signed)
BP 122/79   Pulse 73   Temp (!) 97.5 F (36.4 C) (Oral)   Ht _0  (1.753 m)   Wt 196 lb 3.2 oz (89 kg)   BMI 28.97 kg/m    Subjective:    Patient ID: Catherine [name], female    DOB: 03/21/1956, 62 y.o.   MRN: [id number]  HPI: Catherine [name] is a 62 y.o. female presenting on 08/17/2018 for Shoulder Pain (radiating to neck )  This patient has had several days of shoulder pain.  It is rotating up into her neck and back.  She has some difficulty with range of motion of the shoulder.  She has had other joint issues in the past.  She does not know of any specific injury to this 1.  Past Medical History:  Diagnosis Date  . Depression    Relevant past medical, surgical, family and social history reviewed and updated as indicated. Interim medical history since our last visit reviewed. Allergies and medications reviewed and updated. DATA REVIEWED: CHART IN EPIC  Family History reviewed for pertinent findings.  Review of Systems  Constitutional: Negative.   HENT: Negative.   Eyes: Negative.   Respiratory: Negative.   Gastrointestinal: Negative.   Genitourinary: Negative.   Musculoskeletal: Positive for arthralgias, myalgias and neck pain.    Allergies as of 08/17/2018      Reactions   Penicillins    Swollen throat and face      Medication List        Accurate as of 08/17/18 11:59 PM. Always use your most recent med list.          citalopram 40 MG tablet Commonly known as:  CELEXA Take 1 tablet (40 mg total) by mouth daily.   cyclobenzaprine 10 MG tablet Commonly known as:  FLEXERIL Take 1 tablet (10 mg total) by mouth 3 (three) times daily as needed for muscle spasms.   predniSONE 10 MG (21) Tbpk tablet Commonly known as:  STERAPRED UNI-PAK 21 TAB As directed x 6 days          Objective:    BP 122/79   Pulse 73   Temp (!) 97.5 F (36.4 C) (Oral)   Ht _1  (1.753 m)   Wt 196 lb 3.2 oz (89 kg)   BMI 28.97 kg/m   Allergies  Allergen Reactions  . Penicillins      Swollen throat and face    Wt Readings from Last 3 Encounters:  08/17/18 196 lb 3.2 oz (89 kg)  07/12/18 198 lb 12.8 oz (90.2 kg)  04/15/17 195 lb 9.6 oz (88.7 kg)    Physical Exam  Constitutional: She is oriented to person, place, and time. She appears well-developed and well-nourished.  HENT:  Head: Normocephalic and atraumatic.  Eyes: Pupils are equal, round, and reactive to light. Conjunctivae and EOM are normal.  Cardiovascular: Normal rate, regular rhythm, normal heart sounds and intact distal pulses.  Pulmonary/Chest: Effort normal and breath sounds normal.  Abdominal: Soft. Bowel sounds are normal.  Musculoskeletal:       Cervical back: She exhibits decreased range of motion, pain and spasm.       Back:  Neurological: She is alert and oriented to person, place, and time. She has normal reflexes.  Skin: Skin is warm and dry. No rash noted.  Psychiatric: She has a normal mood and affect. Her behavior is normal. Judgment and thought content normal.    Results for orders placed or performed in visit on  07/12/18  CMP14+EGFR  Result Value Ref Range   Glucose 97 65 - 99 mg/dL   BUN 10 8 - 27 mg/dL   Creatinine, Ser 0.88 0.57 - 1.00 mg/dL   GFR calc non Af Amer 70 >59 mL/min/1.73   GFR calc Af Amer 81 >59 mL/min/1.73   BUN/Creatinine Ratio 11 (L) 12 - 28   Sodium 140 134 - 144 mmol/L   Potassium 4.0 3.5 - 5.2 mmol/L   Chloride 102 96 - 106 mmol/L   CO2 23 20 - 29 mmol/L   Calcium 9.4 8.7 - 10.3 mg/dL   Total Protein 6.6 6.0 - 8.5 g/dL   Albumin 4.0 3.6 - 4.8 g/dL   Globulin, Total 2.6 1.5 - 4.5 g/dL   Albumin/Globulin Ratio 1.5 1.2 - 2.2   Bilirubin Total 1.2 0.0 - 1.2 mg/dL   Alkaline Phosphatase 92 39 - 117 IU/L   AST 32 0 - 40 IU/L   ALT 49 (H) 0 - 32 IU/L      Assessment & Plan:   1. Chronic shoulder pain, unspecified laterality - predniSONE (STERAPRED UNI-PAK 21 TAB) 10 MG (21) TBPK tablet; As directed x 6 days  Dispense: 21 tablet; Refill: 0 -  cyclobenzaprine (FLEXERIL) 10 MG tablet; Take 1 tablet (10 mg total) by mouth 3 (three) times daily as needed for muscle spasms.  Dispense: 30 tablet; Refill: 0   Continue all other maintenance medications as listed above.  Follow up plan: Return if symptoms worsen or fail to improve.  Educational handout given for [place] PA-C [place] [address]  [place], [name] [address] [phone]   08/20/2018, 8:34 AM

## 2019-02-15 ENCOUNTER — Encounter: Payer: Self-pay | Admitting: Physician Assistant

## 2019-02-15 ENCOUNTER — Ambulatory Visit (INDEPENDENT_AMBULATORY_CARE_PROVIDER_SITE_OTHER): Payer: Self-pay | Admitting: Physician Assistant

## 2019-02-15 VITALS — BP 118/78 | HR 88 | Temp 97.8°F | Ht 69.0 in | Wt 193.8 lb

## 2019-02-15 DIAGNOSIS — J0100 Acute maxillary sinusitis, unspecified: Secondary | ICD-10-CM

## 2019-02-15 MED ORDER — AZITHROMYCIN 250 MG PO TABS: ORAL_TABLET | ORAL | 0 refills | Status: DC

## 2019-02-15 NOTE — Progress Notes (Signed)
BP 118/78   Pulse 88   Temp 97.8 F (36.6 C) (Oral)   Ht 5\' 9"  (1.753 m)   Wt 193 lb 12.8 oz (87.9 kg)   BMI 28.62 kg/m    Subjective:    Patient ID: Catherine [name], female    DOB: [birth year], 63 y.o.   MRN: [id number]  HPI: Catherine [name] is a 63 y.o. female presenting on 02/15/2019 for Sore Throat; Headache; Facial Pain; Chills; and Generalized Body Aches  This patient has had many days of sinus headache and postnasal drainage. There is copious drainage at times. Denies any fever at this time. There has been a history of sinus infections in the past.  No history of sinus surgery. There is cough at night. It has become more prevalent in recent days.   Past Medical History:  Diagnosis Date  . Depression    Relevant past medical, surgical, family and social history reviewed and updated as indicated. Interim medical history since our last visit reviewed. Allergies and medications reviewed and updated. DATA REVIEWED: CHART IN EPIC  Family History reviewed for pertinent findings.  Review of Systems  Constitutional: Positive for chills and fatigue. Negative for activity change, appetite change and fever.  HENT: Positive for congestion, postnasal drip, sinus pressure, sinus pain and sore throat.   Eyes: Negative.   Respiratory: Positive for cough. Negative for wheezing.   Cardiovascular: Negative.  Negative for chest pain, palpitations and leg swelling.  Gastrointestinal: Negative.   Genitourinary: Negative.   Musculoskeletal: Negative.   Skin: Negative.   Neurological: Positive for headaches.    Allergies as of 02/15/2019      Reactions   Penicillins    Swollen throat and face      Medication List       Accurate as of February 15, 2019  2:48 PM. Always use your most recent med list.        azithromycin 250 MG tablet Commonly known as:  ZITHROMAX Z-PAK Take as directed   citalopram 40 MG tablet Commonly known as:  CELEXA Take 1 tablet (40 mg total) by mouth daily.            Objective:    BP 118/78   Pulse 88   Temp 97.8 F (36.6 C) (Oral)   Ht 5\' 9"  (1.753 m)   Wt 193 lb 12.8 oz (87.9 kg)   BMI 28.62 kg/m   Allergies  Allergen Reactions  . Penicillins     Swollen throat and face    Wt Readings from Last 3 Encounters:  02/15/19 193 lb 12.8 oz (87.9 kg)  08/17/18 196 lb 3.2 oz (89 kg)  07/12/18 198 lb 12.8 oz (90.2 kg)    Physical Exam Constitutional:      Appearance: She is well-developed.  HENT:     Head: Normocephalic and atraumatic.     Right Ear: Tympanic membrane and external ear normal. No middle ear effusion.     Left Ear: Tympanic membrane and external ear normal.  No middle ear effusion.     Nose: Mucosal edema and rhinorrhea present.     Right Sinus: Maxillary sinus tenderness and frontal sinus tenderness present.     Left Sinus: Maxillary sinus tenderness and frontal sinus tenderness present.     Mouth/Throat:     Pharynx: Uvula midline. Posterior oropharyngeal erythema present.  Eyes:     General:        Right eye: No discharge.        Left  eye: No discharge.     Conjunctiva/sclera: Conjunctivae normal.     Pupils: Pupils are equal, round, and reactive to light.  Neck:     Musculoskeletal: Normal range of motion.  Cardiovascular:     Rate and Rhythm: Normal rate and regular rhythm.     Heart sounds: Normal heart sounds.  Pulmonary:     Effort: Pulmonary effort is normal. No respiratory distress.     Breath sounds: Normal breath sounds. No wheezing.  Abdominal:     Palpations: Abdomen is soft.  Lymphadenopathy:     Cervical: No cervical adenopathy.  Skin:    General: Skin is warm and dry.  Neurological:     Mental Status: She is alert and oriented to person, place, and time.         Assessment & Plan:   1. Acute non-recurrent maxillary sinusitis - azithromycin (ZITHROMAX Z-PAK) 250 MG tablet; Take as directed  Dispense: 6 each; Refill: 0   Continue all other maintenance medications as listed  above.  Follow up plan: No follow-ups on file.  Educational handout given for [place] PA-C [facility] [address]  [place], [place] [address] [phone]   02/15/2019, 2:48 PM

## 2019-02-24 ENCOUNTER — Ambulatory Visit (INDEPENDENT_AMBULATORY_CARE_PROVIDER_SITE_OTHER): Payer: Self-pay | Admitting: Family Medicine

## 2019-02-24 ENCOUNTER — Other Ambulatory Visit: Payer: Self-pay

## 2019-02-24 ENCOUNTER — Encounter: Payer: Self-pay | Admitting: Family Medicine

## 2019-02-24 VITALS — BP 138/80 | HR 83 | Temp 97.7°F | Ht 69.0 in | Wt 191.0 lb

## 2019-02-24 DIAGNOSIS — R300 Dysuria: Secondary | ICD-10-CM

## 2019-02-24 DIAGNOSIS — N3001 Acute cystitis with hematuria: Secondary | ICD-10-CM

## 2019-02-24 LAB — URINALYSIS, COMPLETE
BILIRUBIN UA: NEGATIVE
Glucose, UA: NEGATIVE
Ketones, UA: NEGATIVE
Nitrite, UA: NEGATIVE
Specific Gravity, UA: 1.025 (ref 1.005–1.030)
UUROB: 1 mg/dL (ref 0.2–1.0)
pH, UA: 6 (ref 5.0–7.5)

## 2019-02-24 LAB — MICROSCOPIC EXAMINATION

## 2019-02-24 MED ORDER — SULFAMETHOXAZOLE-TRIMETHOPRIM 800-160 MG PO TABS: 1.0000 | ORAL_TABLET | Freq: Two times a day (BID) | ORAL | 0 refills | Status: DC

## 2019-02-24 NOTE — Progress Notes (Signed)
Catherine [name], Theador Hawthorne, FNP Chief Complaint  Patient presents with  . Urinary Tract Infection    dysuria, on and off for the last several days    Current Issues:  Presents with 1 day of dysuria, urinary urgency and urinary frequency Associated symptoms include:  cloudy urine and dysuria  There is no history of of similar symptoms. Sexually active:  No   No concern for STI.  Prior to Admission medications   Medication Sig Start Date End Date Taking? Authorizing Provider  citalopram (CELEXA) 40 MG tablet Take 1 tablet (40 mg total) by mouth daily. 07/12/18  Yes Hawks, Christy A, FNP  sulfamethoxazole-trimethoprim (SEPTRA DS) 800-160 MG tablet Take 1 tablet by mouth 2 (two) times daily. 02/24/19   Baruch Gouty, FNP    Review of Systems Review of Systems  Constitutional: Negative for chills, fever and malaise/fatigue.  Respiratory: Negative for shortness of breath.   Cardiovascular: Negative for chest pain.  Gastrointestinal: Negative for abdominal pain, constipation, diarrhea, nausea and vomiting.  Genitourinary: Positive for dysuria, frequency and urgency. Negative for flank pain and hematuria.  Neurological: Negative for weakness.  All other systems reviewed and are negative.    PE:  BP 138/80   Pulse 83   Temp 97.7 F (36.5 C) (Oral)   Ht 5' 9"  (1.753 m)   Wt 191 lb (86.6 kg)   BMI 28.21 kg/m  Physical Exam  Constitutional: She is oriented to person, place, and time and well-developed, well-nourished, and in no distress. Vital signs are normal. No distress.  HENT:  Head: Normocephalic and atraumatic.  Eyes: Pupils are equal, round, and reactive to light. Conjunctivae are normal.  Neck: Neck supple.  Cardiovascular: Normal rate and regular rhythm. Exam reveals friction rub.  No murmur heard. Pulmonary/Chest: Effort normal and breath sounds normal.  Abdominal: Soft. Bowel sounds are normal. She exhibits no distension. There is no hepatosplenomegaly. There is no  abdominal tenderness. There is no CVA tenderness.  Neurological: She is alert and oriented to person, place, and time.  Skin: Skin is warm and dry. She is not diaphoretic.  Psychiatric: Mood, memory, affect and judgment normal.     Results for orders placed or performed in visit on 07/12/18  CMP14+EGFR  Result Value Ref Range   Glucose 97 65 - 99 mg/dL   BUN 10 8 - 27 mg/dL   Creatinine, Ser 0.88 0.57 - 1.00 mg/dL   GFR calc non Af Amer 70 >59 mL/min/1.73   GFR calc Af Amer 81 >59 mL/min/1.73   BUN/Creatinine Ratio 11 (L) 12 - 28   Sodium 140 134 - 144 mmol/L   Potassium 4.0 3.5 - 5.2 mmol/L   Chloride 102 96 - 106 mmol/L   CO2 23 20 - 29 mmol/L   Calcium 9.4 8.7 - 10.3 mg/dL   Total Protein 6.6 6.0 - 8.5 g/dL   Albumin 4.0 3.6 - 4.8 g/dL   Globulin, Total 2.6 1.5 - 4.5 g/dL   Albumin/Globulin Ratio 1.5 1.2 - 2.2   Bilirubin Total 1.2 0.0 - 1.2 mg/dL   Alkaline Phosphatase 92 39 - 117 IU/L   AST 32 0 - 40 IU/L   ALT 49 (H) 0 - 32 IU/L   Urinalysis in office: moderate bacteria, 1+ leukocytes, 1+ protein, trace blood  Assessment and Plan:    Roselin was seen today for urinary tract infection.  Diagnoses and all orders for this visit:  Dysuria -     Urinalysis, Complete  Acute  cystitis with hematuria Symptomatic care discussed. Culture pending. Will change antibiotics if warranted. Increase water intake. Report any new or worsening symptoms.  -     sulfamethoxazole-trimethoprim (SEPTRA DS) 800-160 MG tablet; Take 1 tablet by mouth 2 (two) times daily. -     Urine culture  Return in 4 weeks (on 03/24/2019), or if symptoms worsen or fail to improve, for repeat urine (hematuria).  The above assessment and management plan was discussed with the patient. The patient verbalized understanding of and has agreed to the management plan. Patient is aware to call the clinic if symptoms fail to improve or worsen. Patient is aware when to return to the clinic for a follow-up visit.  Patient educated on when it is appropriate to go to the emergency department.   Monia Pouch, FNP-C [facility] [address] [place], [place] [address] [phone]

## 2019-02-24 NOTE — Patient Instructions (Signed)

## 2019-02-26 LAB — URINE CULTURE

## 2019-10-25 ENCOUNTER — Other Ambulatory Visit: Payer: Self-pay | Admitting: *Deleted

## 2019-10-25 DIAGNOSIS — F329 Major depressive disorder, single episode, unspecified: Secondary | ICD-10-CM

## 2019-10-25 DIAGNOSIS — F32A Depression, unspecified: Secondary | ICD-10-CM

## 2019-10-25 MED ORDER — CITALOPRAM HYDROBROMIDE 40 MG PO TABS: 40.0000 mg | ORAL_TABLET | Freq: Every day | ORAL | 0 refills | Status: DC

## 2019-11-15 ENCOUNTER — Other Ambulatory Visit: Payer: Self-pay

## 2019-11-16 ENCOUNTER — Encounter: Payer: Self-pay | Admitting: Family

## 2019-11-16 ENCOUNTER — Ambulatory Visit (INDEPENDENT_AMBULATORY_CARE_PROVIDER_SITE_OTHER): Payer: Medicare Other | Admitting: Family

## 2019-11-16 DIAGNOSIS — F320 Major depressive disorder, single episode, mild: Secondary | ICD-10-CM | POA: Diagnosis not present

## 2019-11-16 DIAGNOSIS — M545 Low back pain, unspecified: Secondary | ICD-10-CM

## 2019-11-16 DIAGNOSIS — F411 Generalized anxiety disorder: Secondary | ICD-10-CM | POA: Insufficient documentation

## 2019-11-16 MED ORDER — CYCLOBENZAPRINE HCL 10 MG PO TABS: 10.0000 mg | ORAL_TABLET | Freq: Three times a day (TID) | ORAL | 0 refills | Status: DC | PRN

## 2019-11-16 MED ORDER — CITALOPRAM HYDROBROMIDE 40 MG PO TABS: 40.0000 mg | ORAL_TABLET | Freq: Every day | ORAL | 0 refills | Status: DC

## 2019-11-16 NOTE — Progress Notes (Signed)
Virtual Visit via telephone Note Due to COVID-19 pandemic this visit was conducted virtually. This visit type was conducted due to national recommendations for restrictions regarding the COVID-19 Pandemic (e.g. social distancing, sheltering in place) in an effort to limit this patient's exposure and mitigate transmission in our community. All issues noted in this document were discussed and addressed.  A physical exam was not performed with this format.  Attempted to call patient at 1:10 pm, no answer and unable to leave VM.   I connected with Catherine [name] on 11/16/19 at 2:20 pm  by telephone and verified that I am speaking with the correct person using two identifiers. Catherine [name] is currently located at daughter's home and husband is currently with her during visit. The provider, Evelina Dun, FNP is located in their office at time of visit.  I discussed the limitations, risks, security and privacy concerns of performing an evaluation and management service by telephone and the availability of in person appointments. I also discussed with the patient that there may be a patient responsible charge related to this service. The patient expressed understanding and agreed to proceed.   History and Present Illness:  PT calls the office today for medication refill of Celexa for GAD and depression. She states she is doing well at this time.  Anxiety Presents for follow-up visit. Symptoms include excessive worry. Patient reports no chest pain, decreased concentration, depressed mood, irritability, palpitations, panic or restlessness. Symptoms occur occasionally. The quality of sleep is good.    Depression        This is a chronic problem.  The current episode started more than 1 year ago.   The onset quality is gradual.   Associated symptoms include no decreased concentration, no helplessness, no hopelessness, not irritable, no restlessness and not sad.  Past treatments include SSRIs - Selective  serotonin reuptake inhibitors.  Past medical history includes anxiety.   Back Pain This is a recurrent problem. The current episode started more than 1 year ago. The problem occurs intermittently. The problem has been waxing and waning since onset. The pain is present in the lumbar spine. The quality of the pain is described as aching. The pain does not radiate. The pain is at a severity of 8/10. The pain is moderate. Pertinent negatives include no chest pain, dysuria, leg pain or numbness. She has tried heat and muscle relaxant for the symptoms. The treatment provided moderate relief.      Review of Systems  Constitutional: Negative for irritability.  Cardiovascular: Negative for chest pain and palpitations.  Genitourinary: Negative for dysuria.  Musculoskeletal: Positive for back pain.  Neurological: Negative for numbness.  Psychiatric/Behavioral: Positive for depression. Negative for decreased concentration.  All other systems reviewed and are negative.    Observations/Objective: No SOB or distress noted  Assessment and Plan: 1. GAD (generalized anxiety disorder) - citalopram (CELEXA) 40 MG tablet; Take 1 tablet (40 mg total) by mouth daily.  Dispense: 90 tablet; Refill: 0  2. Depression, major, single episode, mild (HCC) - citalopram (CELEXA) 40 MG tablet; Take 1 tablet (40 mg total) by mouth daily.  Dispense: 90 tablet; Refill: 0  3. Acute midline low back pain without sciatica - cyclobenzaprine (FLEXERIL) 10 MG tablet; Take 1 tablet (10 mg total) by mouth 3 (three) times daily as needed for muscle spasms.  Dispense: 30 tablet; Refill: 0  Continue medications Pt will call and make appt for next month for pap and CPE Healthy diet and exercise encouraged  I discussed the assessment and treatment plan with the patient. The patient was provided an opportunity to ask questions and all were answered. The patient agreed with the plan and demonstrated an understanding of the  instructions.   The patient was advised to call back or seek an in-person evaluation if the symptoms worsen or if the condition fails to improve as anticipated.  The above assessment and management plan was discussed with the patient. The patient verbalized understanding of and has agreed to the management plan. Patient is aware to call the clinic if symptoms persist or worsen. Patient is aware when to return to the clinic for a follow-up visit. Patient educated on when it is appropriate to go to the emergency department.   Time call ended:  2:33 pm  I provided 13 minutes of non-face-to-face time during this encounter.    Evelina Dun, FNP

## 2020-01-15 ENCOUNTER — Ambulatory Visit (INDEPENDENT_AMBULATORY_CARE_PROVIDER_SITE_OTHER): Payer: Medicare Other | Admitting: Family Medicine

## 2020-01-15 ENCOUNTER — Telehealth: Payer: Self-pay | Admitting: Family

## 2020-01-15 ENCOUNTER — Encounter: Payer: Self-pay | Admitting: Family Medicine

## 2020-01-15 DIAGNOSIS — F320 Major depressive disorder, single episode, mild: Secondary | ICD-10-CM

## 2020-01-15 DIAGNOSIS — F411 Generalized anxiety disorder: Secondary | ICD-10-CM

## 2020-01-15 DIAGNOSIS — N3090 Cystitis, unspecified without hematuria: Secondary | ICD-10-CM

## 2020-01-15 MED ORDER — NITROFURANTOIN MONOHYD MACRO 100 MG PO CAPS: 100.0000 mg | ORAL_CAPSULE | Freq: Two times a day (BID) | ORAL | 0 refills | Status: DC

## 2020-01-15 MED ORDER — CITALOPRAM HYDROBROMIDE 40 MG PO TABS: 40.0000 mg | ORAL_TABLET | Freq: Every day | ORAL | 0 refills | Status: DC

## 2020-01-15 NOTE — Progress Notes (Signed)
Subjective:    Patient ID: Catherine [name], female    DOB: March 16, 1956, 64 y.o.   MRN: [id number]   HPI: Catherine [name] is a 64 y.o. female presenting for urinary frequency "every three minutes." Dysuria as well. Also having urgency. Denies fever, back pain, nausea. Onset last night.    Depression screen [facility] 2/9 11/16/2019 02/24/2019 02/15/2019 08/17/2018 07/12/2018  Decreased Interest 0 1 1 1 1   Down, Depressed, Hopeless 0 0 1 1 1   PHQ - 2 Score 0 1 2 2 2   Altered sleeping 0 - 2 3 3   Tired, decreased energy 0 - 2 1 1   Change in appetite 0 - 0 0 1  Feeling bad or failure about yourself  0 - 0 0 0  Trouble concentrating 0 - 0 1 1  Moving slowly or fidgety/restless 0 - 0 0 0  Suicidal thoughts 0 - 0 0 0  PHQ-9 Score 0 - 6 7 8   Difficult doing work/chores Not difficult at all - - - -     Relevant past medical, surgical, family and social history reviewed and updated as indicated.  Interim medical history since our last visit reviewed. Allergies and medications reviewed and updated.  ROS:  Review of Systems  Constitutional: Negative for chills, diaphoresis and fever.  HENT: Negative.   Cardiovascular: Negative for chest pain.  Gastrointestinal: Negative for nausea.  Genitourinary: Positive for dysuria, frequency and urgency. Negative for decreased urine volume, flank pain, hematuria, menstrual problem and pelvic pain.  Skin: Negative for rash.     Social History   Tobacco Use  Smoking Status Never Smoker  Smokeless Tobacco Never Used       Objective:     Wt Readings from Last 3 Encounters:  02/24/19 191 lb (86.6 kg)  02/15/19 193 lb 12.8 oz (87.9 kg)  08/17/18 196 lb 3.2 oz (89 kg)     Exam deferred. Pt. Harboring due to COVID 19. Phone visit performed.   Assessment & Plan:   1. Cystitis     Meds ordered this encounter  Medications  . nitrofurantoin, macrocrystal-monohydrate, (MACROBID) 100 MG capsule    Sig: Take 1 capsule (100 mg total) by mouth 2 (two) times  daily.    Dispense:  14 capsule    Refill:  0    No orders of the defined types were placed in this encounter.     Diagnoses and all orders for this visit:  Cystitis  Other orders -     nitrofurantoin, macrocrystal-monohydrate, (MACROBID) 100 MG capsule; Take 1 capsule (100 mg total) by mouth 2 (two) times daily.    Virtual Visit via telephone Note  I discussed the limitations, risks, security and privacy concerns of performing an evaluation and management service by telephone and the availability of in person appointments. The patient was identified with two identifiers. Pt.expressed understanding and agreed to proceed. Pt. Is at home. Dr. Livia Snellen is in his office.  Follow Up Instructions:   I discussed the assessment and treatment plan with the patient. The patient was provided an opportunity to ask questions and all were answered. The patient agreed with the plan and demonstrated an understanding of the instructions.   The patient was advised to call back or seek an in-person evaluation if the symptoms worsen or if the condition fails to improve as anticipated.   Total minutes including chart review and phone contact time: 7   Follow up plan: No follow-ups on file.  Catherine Fraise, MD [facility]  [facility]

## 2020-01-15 NOTE — Telephone Encounter (Signed)
nvm-cb 2/1 meds sent

## 2020-05-23 ENCOUNTER — Other Ambulatory Visit: Payer: Self-pay | Admitting: Family

## 2020-05-23 DIAGNOSIS — F411 Generalized anxiety disorder: Secondary | ICD-10-CM

## 2020-05-23 DIAGNOSIS — F320 Major depressive disorder, single episode, mild: Secondary | ICD-10-CM

## 2020-10-02 ENCOUNTER — Other Ambulatory Visit: Payer: Self-pay | Admitting: Family

## 2020-10-02 DIAGNOSIS — F411 Generalized anxiety disorder: Secondary | ICD-10-CM

## 2020-10-02 DIAGNOSIS — F320 Major depressive disorder, single episode, mild: Secondary | ICD-10-CM

## 2020-10-03 NOTE — Telephone Encounter (Signed)
[facility] NTBS 30 days given 05/23/20 LOV 11/16/19

## 2020-10-03 NOTE — Telephone Encounter (Signed)
Attempted to contact - NA 

## 2020-10-21 ENCOUNTER — Telehealth: Payer: Self-pay

## 2020-10-21 DIAGNOSIS — F320 Major depressive disorder, single episode, mild: Secondary | ICD-10-CM

## 2020-10-21 DIAGNOSIS — F411 Generalized anxiety disorder: Secondary | ICD-10-CM

## 2020-10-21 MED ORDER — CITALOPRAM HYDROBROMIDE 40 MG PO TABS: 40.0000 mg | ORAL_TABLET | Freq: Every day | ORAL | 0 refills | Status: DC

## 2020-10-21 NOTE — Telephone Encounter (Signed)
  Prescription Request  10/21/2020  What is the name of the medication or equipment? Citrapram  Have you contacted your pharmacy to request a refill? (if applicable) yes   Which pharmacy would you like this sent to? [facility]   Patient notified that their request is being sent to the clinical staff for review and that they should receive a response within 2 business days.   [name]' pt.  She has an appt next week with [name].  She wants 10 day supply until appt.

## 2020-10-21 NOTE — Telephone Encounter (Signed)
NA / NVM, refill sent to [facility]

## 2020-10-30 ENCOUNTER — Encounter: Payer: Self-pay | Admitting: Family Medicine

## 2020-10-30 ENCOUNTER — Other Ambulatory Visit: Payer: Self-pay

## 2020-10-30 ENCOUNTER — Ambulatory Visit (INDEPENDENT_AMBULATORY_CARE_PROVIDER_SITE_OTHER): Payer: Medicare Other | Admitting: Family Medicine

## 2020-10-30 VITALS — BP 118/70 | HR 73 | Temp 97.4°F | Ht 69.0 in | Wt 195.1 lb

## 2020-10-30 DIAGNOSIS — F320 Major depressive disorder, single episode, mild: Secondary | ICD-10-CM

## 2020-10-30 DIAGNOSIS — F5101 Primary insomnia: Secondary | ICD-10-CM | POA: Diagnosis not present

## 2020-10-30 DIAGNOSIS — Z1231 Encounter for screening mammogram for malignant neoplasm of breast: Secondary | ICD-10-CM

## 2020-10-30 DIAGNOSIS — F411 Generalized anxiety disorder: Secondary | ICD-10-CM | POA: Diagnosis not present

## 2020-10-30 DIAGNOSIS — Z6828 Body mass index (BMI) 28.0-28.9, adult: Secondary | ICD-10-CM | POA: Diagnosis not present

## 2020-10-30 MED ORDER — TRAZODONE HCL 50 MG PO TABS: 25.0000 mg | ORAL_TABLET | Freq: Every evening | ORAL | 3 refills | Status: DC | PRN

## 2020-10-30 MED ORDER — CITALOPRAM HYDROBROMIDE 40 MG PO TABS: 40.0000 mg | ORAL_TABLET | Freq: Every day | ORAL | 3 refills | Status: DC

## 2020-10-30 NOTE — Patient Instructions (Signed)

## 2020-10-30 NOTE — Progress Notes (Signed)
Subjective: CC: medication refill PCP: Catherine Balloon, FNP  MCN:OBSJG [name] is a 64 y.o. female presenting to clinic today for:  1. Anxiety/Depression Catherine needs a refill on her citalopram today. She has not been in the office recently because of the pandemic. She feels like the citalopram is controlling her symptoms well. She does reports insomnia though. This has been going on for 2 years. She reports difficulty falling and staying asleep. She sometimes takes Z-quil with some relief, but not always. She has tried melatonin without relief. She reports feeling tired when she goes to bed. She denies worrying or anxiety at night. She tries to go to bed at the same time, keeps the room dark, and tries to avoid electronics before bed. She would like to try sometime to help her sleep as she believes she would help with her depression as well.  Depression screen [facility] 2/9 10/30/2020 11/16/2019 02/24/2019  Decreased Interest 0 0 1  Down, Depressed, Hopeless 2 0 0  PHQ - 2 Score 2 0 1  Altered sleeping 2 0 -  Tired, decreased energy 2 0 -  Change in appetite 0 0 -  Feeling bad or failure about yourself  0 0 -  Trouble concentrating 0 0 -  Moving slowly or fidgety/restless 0 0 -  Suicidal thoughts 0 0 -  PHQ-9 Score 6 0 -  Difficult doing work/chores Somewhat difficult Not difficult at all -   GAD 7 : Generalized Anxiety Score 10/30/2020  Nervous, Anxious, on Edge 0  Control/stop worrying 0  Worry too much - different things 0  Trouble relaxing 0  Restless 0  Easily annoyed or irritable 0  Afraid - awful might happen 0  Total GAD 7 Score 0  Anxiety Difficulty Not difficult at all    Relevant past medical, surgical, family, and social history reviewed and updated as indicated.  Allergies and medications reviewed and updated.  Allergies  Allergen Reactions  . Penicillins     Swollen throat and face   Past Medical History:  Diagnosis Date  . Depression     Current Outpatient  Medications:  .  citalopram (CELEXA) 40 MG tablet, Take 1 tablet (40 mg total) by mouth daily., Disp: 30 tablet, Rfl: 0 .  cyclobenzaprine (FLEXERIL) 10 MG tablet, Take 1 tablet (10 mg total) by mouth 3 (three) times daily as needed for muscle spasms., Disp: 30 tablet, Rfl: 0 Social History   Socioeconomic History  . Marital status: Married    Spouse name: Not on file  . Number of children: Not on file  . Years of education: Not on file  . Highest education level: Not on file  Occupational History  . Not on file  Tobacco Use  . Smoking status: Never Smoker  . Smokeless tobacco: Never Used  Vaping Use  . Vaping Use: Never used  Substance and Sexual Activity  . Alcohol use: Not on file  . Drug use: Never  . Sexual activity: Not on file  Other Topics Concern  . Not on file  Social History Narrative  . Not on file   Social Determinants of Health   Financial Resource Strain:   . Difficulty of Paying Living Expenses: Not on file  Food Insecurity:   . Worried About Charity fundraiser in the Last Year: Not on file  . Ran Out of Food in the Last Year: Not on file  Transportation Needs:   . Lack of Transportation (Medical): Not on file  .  Lack of Transportation (Non-Medical): Not on file  Physical Activity:   . Days of Exercise per Week: Not on file  . Minutes of Exercise per Session: Not on file  Stress:   . Feeling of Stress : Not on file  Social Connections:   . Frequency of Communication with Friends and Family: Not on file  . Frequency of Social Gatherings with Friends and Family: Not on file  . Attends Religious Services: Not on file  . Active Member of Clubs or Organizations: Not on file  . Attends Archivist Meetings: Not on file  . Marital Status: Not on file  Intimate Partner Violence:   . Fear of Current or Ex-Partner: Not on file  . Emotionally Abused: Not on file  . Physically Abused: Not on file  . Sexually Abused: Not on file   Family History    Problem Relation Age of Onset  . Diabetes Mother   . Hypertension Mother   . Cancer Father        lung  . Hypertension Father     Review of Systems  Per HPI.  Objective: Office vital signs reviewed. BP 118/70   Pulse 73   Temp (!) 97.4 F (36.3 C) (Temporal)   Ht 5' 9"  (1.753 m)   Wt 195 lb 2 oz (88.5 kg)   BMI 28.81 kg/m   Physical Examination:  Physical Exam Vitals and nursing note reviewed.  Constitutional:      General: She is not in acute distress.    Appearance: Normal appearance. She is not ill-appearing, toxic-appearing or diaphoretic.  Cardiovascular:     Rate and Rhythm: Normal rate and regular rhythm.     Heart sounds: Normal heart sounds. No murmur heard.   Pulmonary:     Effort: Pulmonary effort is normal. No respiratory distress.     Breath sounds: Normal breath sounds.  Musculoskeletal:     Right lower leg: No edema.     Left lower leg: No edema.  Skin:    General: Skin is warm and dry.  Neurological:     General: No focal deficit present.     Mental Status: She is alert and oriented to person, place, and time.  Psychiatric:        Mood and Affect: Mood normal.        Behavior: Behavior normal.        Thought Content: Thought content normal.        Judgment: Judgment normal.      Results for orders placed or performed in visit on 02/24/19  Urine culture   Specimen: Urine   URINE  Result Value Ref Range   Urine Culture, Routine Final report (A)    Organism ID, Bacteria Escherichia coli (A)    Antimicrobial Susceptibility Comment   Microscopic Examination   URINE  Result Value Ref Range   WBC, UA 11-30 (A) 0 - 5 /hpf   RBC, UA 3-10 (A) 0 - 2 /hpf   Epithelial Cells (non renal) 0-10 0 - 10 /hpf   Renal Epithel, UA 0-10 (A) None seen /hpf   Mucus, UA Present Not Estab.   Bacteria, UA Moderate (A) None seen/Few  Urinalysis, Complete  Result Value Ref Range   Specific Gravity, UA 1.025 1.005 - 1.030   pH, UA 6.0 5.0 - 7.5   Color, UA  Yellow Yellow   Appearance Ur Clear Clear   Leukocytes, UA 1+ (A) Negative   Protein, UA 1+ (A) Negative/Trace  Glucose, UA Negative Negative   Ketones, UA Negative Negative   RBC, UA Trace (A) Negative   Bilirubin, UA Negative Negative   Urobilinogen, Ur 1.0 0.2 - 1.0 mg/dL   Nitrite, UA Negative Negative   Microscopic Examination See below:      Assessment/ Plan: Catherine [name] was seen today for anxiety.  Diagnoses and all orders for this visit:  Depression, major, single episode, mild (HCC) Continue citalopram. Add trazodone for insomnia. Return to office for new or worsening symptoms, or if symptoms persist.  -     traZODone (DESYREL) 50 MG tablet; Take 0.5-1 tablets (25-50 mg total) by mouth at bedtime as needed for sleep. -     citalopram (CELEXA) 40 MG tablet; Take 1 tablet (40 mg total) by mouth daily.  GAD (generalized anxiety disorder) Well controlled on current regimen.  -     citalopram (CELEXA) 40 MG tablet; Take 1 tablet (40 mg total) by mouth daily.  Primary insomnia Handout given. Add trazodone. Return to office for new or worsening symptoms, or if symptoms persist.  -     traZODone (DESYREL) 50 MG tablet; Take 0.5-1 tablets (25-50 mg total) by mouth at bedtime as needed for sleep.  BMI 28.0-28.9,adult Patient will return for fasting labs.  -     CMP14+EGFR; Future -     CBC with Differential/Platelet; Future -     Thyroid Panel With TSH; Future  Screening mammogram for breast cancer -     MM 3D SCREEN BREAST BILATERAL; Future  Follow up as needed with PCP.   The above assessment and management plan was discussed with the patient. The patient verbalized understanding of and has agreed to the management plan. Patient is aware to call the clinic if symptoms persist or worsen. Patient is aware when to return to the clinic for a follow-up visit. Patient educated on when it is appropriate to go to the emergency department.   [name], FNP-C [facility] [address] [place], [place] [address] [phone]

## 2020-11-20 ENCOUNTER — Ambulatory Visit
Admission: RE | Admit: 2020-11-20 | Discharge: 2020-11-20 | Disposition: A | Discharge status: Home / Self Care | Payer: Medicare Other | Source: Ambulatory Visit | Attending: Family Medicine | Admitting: Family Medicine

## 2020-11-20 ENCOUNTER — Other Ambulatory Visit: Payer: Self-pay

## 2020-11-20 DIAGNOSIS — Z1231 Encounter for screening mammogram for malignant neoplasm of breast: Secondary | ICD-10-CM

## 2020-12-02 ENCOUNTER — Other Ambulatory Visit: Payer: Self-pay | Admitting: Family Medicine

## 2020-12-02 DIAGNOSIS — R928 Other abnormal and inconclusive findings on diagnostic imaging of breast: Secondary | ICD-10-CM

## 2020-12-27 ENCOUNTER — Other Ambulatory Visit: Payer: Self-pay

## 2020-12-27 ENCOUNTER — Ambulatory Visit: Payer: Medicare Other

## 2020-12-27 ENCOUNTER — Ambulatory Visit
Admission: RE | Admit: 2020-12-27 | Discharge: 2020-12-27 | Disposition: A | Discharge status: Home / Self Care | Payer: Medicare Other | Source: Ambulatory Visit | Attending: Family Medicine | Admitting: Family Medicine

## 2020-12-27 DIAGNOSIS — R928 Other abnormal and inconclusive findings on diagnostic imaging of breast: Secondary | ICD-10-CM

## 2020-12-30 ENCOUNTER — Ambulatory Visit: Payer: Medicare Other | Admitting: Family

## 2021-04-15 ENCOUNTER — Other Ambulatory Visit: Payer: Self-pay | Admitting: Family Medicine

## 2021-04-15 DIAGNOSIS — F5101 Primary insomnia: Secondary | ICD-10-CM

## 2021-04-15 DIAGNOSIS — F320 Major depressive disorder, single episode, mild: Secondary | ICD-10-CM

## 2021-05-24 ENCOUNTER — Other Ambulatory Visit: Payer: Self-pay | Admitting: Family Medicine

## 2021-05-24 DIAGNOSIS — F5101 Primary insomnia: Secondary | ICD-10-CM

## 2021-05-24 DIAGNOSIS — F320 Major depressive disorder, single episode, mild: Secondary | ICD-10-CM

## 2021-06-19 ENCOUNTER — Other Ambulatory Visit: Payer: Self-pay | Admitting: Family

## 2021-06-19 DIAGNOSIS — F320 Major depressive disorder, single episode, mild: Secondary | ICD-10-CM

## 2021-06-19 DIAGNOSIS — F5101 Primary insomnia: Secondary | ICD-10-CM

## 2021-06-20 ENCOUNTER — Ambulatory Visit (INDEPENDENT_AMBULATORY_CARE_PROVIDER_SITE_OTHER): Payer: Medicare Other

## 2021-06-20 ENCOUNTER — Encounter: Payer: Self-pay | Admitting: Family

## 2021-06-20 ENCOUNTER — Other Ambulatory Visit: Payer: Self-pay

## 2021-06-20 ENCOUNTER — Ambulatory Visit (INDEPENDENT_AMBULATORY_CARE_PROVIDER_SITE_OTHER): Payer: Medicare Other | Admitting: Family

## 2021-06-20 VITALS — BP 121/65 | HR 69 | Temp 97.4°F | Ht 69.0 in | Wt 196.8 lb

## 2021-06-20 DIAGNOSIS — G4700 Insomnia, unspecified: Secondary | ICD-10-CM | POA: Insufficient documentation

## 2021-06-20 DIAGNOSIS — Z0000 Encounter for general adult medical examination without abnormal findings: Secondary | ICD-10-CM | POA: Diagnosis not present

## 2021-06-20 DIAGNOSIS — E663 Overweight: Secondary | ICD-10-CM

## 2021-06-20 DIAGNOSIS — Z23 Encounter for immunization: Secondary | ICD-10-CM | POA: Diagnosis not present

## 2021-06-20 DIAGNOSIS — Z1159 Encounter for screening for other viral diseases: Secondary | ICD-10-CM | POA: Diagnosis not present

## 2021-06-20 DIAGNOSIS — M549 Dorsalgia, unspecified: Secondary | ICD-10-CM | POA: Insufficient documentation

## 2021-06-20 DIAGNOSIS — F411 Generalized anxiety disorder: Secondary | ICD-10-CM | POA: Diagnosis not present

## 2021-06-20 DIAGNOSIS — F5101 Primary insomnia: Secondary | ICD-10-CM

## 2021-06-20 DIAGNOSIS — F320 Major depressive disorder, single episode, mild: Secondary | ICD-10-CM

## 2021-06-20 DIAGNOSIS — M545 Low back pain, unspecified: Secondary | ICD-10-CM

## 2021-06-20 DIAGNOSIS — Z0001 Encounter for general adult medical examination with abnormal findings: Secondary | ICD-10-CM

## 2021-06-20 DIAGNOSIS — Z780 Asymptomatic menopausal state: Secondary | ICD-10-CM

## 2021-06-20 DIAGNOSIS — M85852 Other specified disorders of bone density and structure, left thigh: Secondary | ICD-10-CM | POA: Diagnosis not present

## 2021-06-20 MED ORDER — TRAZODONE HCL 50 MG PO TABS: 50.0000 mg | ORAL_TABLET | Freq: Every evening | ORAL | 0 refills | Status: DC | PRN

## 2021-06-20 MED ORDER — CITALOPRAM HYDROBROMIDE 40 MG PO TABS: 40.0000 mg | ORAL_TABLET | Freq: Every day | ORAL | 3 refills | Status: DC

## 2021-06-20 MED ORDER — CYCLOBENZAPRINE HCL 10 MG PO TABS: 10.0000 mg | ORAL_TABLET | Freq: Three times a day (TID) | ORAL | 0 refills | Status: DC | PRN

## 2021-06-20 NOTE — Addendum Note (Signed)
Addended by: Brynda Peon F on: 06/20/2021 11:30 AM   Modules accepted: Orders

## 2021-06-20 NOTE — Patient Instructions (Signed)

## 2021-06-20 NOTE — Progress Notes (Signed)
Subjective:    Patient ID: Catherine [name], female    DOB: 1956/11/19, 65 y.o.   MRN: [id number]  Chief Complaint  Patient presents with   Medical Management of Chronic Issues   Pt presents to the office today for CPE and chronic follow up. She reports she just had her first grand baby.  Anxiety Presents for follow-up visit. Symptoms include depressed mood, excessive worry, insomnia, nervous/anxious behavior and restlessness. Patient reports no irritability.    Depression        This is a chronic problem.  The current episode started more than 1 year ago.   The onset quality is gradual.   The problem occurs intermittently.  Associated symptoms include insomnia and restlessness.  Associated symptoms include no helplessness, no hopelessness, not irritable and not sad.  Past treatments include SSRIs - Selective serotonin reuptake inhibitors.  Past medical history includes anxiety.   Insomnia Primary symptoms: difficulty falling asleep, frequent awakening.   The current episode started more than one year. The onset quality is gradual. The problem occurs intermittently. The problem has been waxing and waning since onset. PMH includes: depression.   Back Pain This is a chronic problem. The current episode started more than 1 year ago. The problem occurs intermittently. The pain is present in the lumbar spine. The pain is at a severity of 0/10 (0 today, but when it flares up 5).     Review of Systems  Constitutional:  Negative for irritability.  Musculoskeletal:  Positive for back pain.  Psychiatric/Behavioral:  Positive for depression. The patient is nervous/anxious and has insomnia.   All other systems reviewed and are negative.  Family History  Problem Relation Age of Onset   Diabetes Mother    Hypertension Mother    Cancer Father        lung   Hypertension Father    Social History   Socioeconomic History   Marital status: Married    Spouse name: Not on file   Number of  children: Not on file   Years of education: Not on file   Highest education level: Not on file  Occupational History   Not on file  Tobacco Use   Smoking status: Never   Smokeless tobacco: Never  Vaping Use   Vaping Use: Never used  Substance and Sexual Activity   Alcohol use: Not on file   Drug use: Never   Sexual activity: Not on file  Other Topics Concern   Not on file  Social History Narrative   Not on file   Social Determinants of Health   Financial Resource Strain: Not on file  Food Insecurity: Not on file  Transportation Needs: Not on file  Physical Activity: Not on file  Stress: Not on file  Social Connections: Not on file       Objective:   Physical Exam Vitals reviewed.  Constitutional:      General: She is not irritable.She is not in acute distress.    Appearance: She is well-developed.  HENT:     Head: Normocephalic and atraumatic.     Right Ear: Tympanic membrane normal.     Left Ear: Tympanic membrane normal.  Eyes:     Pupils: Pupils are equal, round, and reactive to light.  Neck:     Thyroid: No thyromegaly.  Cardiovascular:     Rate and Rhythm: Normal rate and regular rhythm.     Heart sounds: Normal heart sounds. No murmur heard. Pulmonary:  Effort: Pulmonary effort is normal. No respiratory distress.     Breath sounds: Normal breath sounds. No wheezing.  Abdominal:     General: Bowel sounds are normal. There is no distension.     Palpations: Abdomen is soft.     Tenderness: There is no abdominal tenderness.  Musculoskeletal:        General: No tenderness. Normal range of motion.     Cervical back: Normal range of motion and neck supple.  Skin:    General: Skin is warm and dry.  Neurological:     Mental Status: She is alert and oriented to person, place, and time.     Cranial Nerves: No cranial nerve deficit.     Deep Tendon Reflexes: Reflexes are normal and symmetric.  Psychiatric:        Behavior: Behavior normal.        Thought  Content: Thought content normal.        Judgment: Judgment normal.      BP 121/65   Pulse 69   Temp (!) 97.4 F (36.3 C) (Temporal)   Ht _0  (1.753 m)   Wt 196 lb 12.8 oz (89.3 kg)   SpO2 97%   BMI 29.06 kg/m      Assessment & Plan:  Janene Sladek comes in today with chief complaint of Medical Management of Chronic Issues   Diagnosis and orders addressed:  1. Depression, major, single episode, mild (HCC) - citalopram (CELEXA) 40 MG tablet; Take 1 tablet (40 mg total) by mouth daily.  Dispense: 90 tablet; Refill: 3 - traZODone (DESYREL) 50 MG tablet; Take 1 tablet (50 mg total) by mouth at bedtime as needed for sleep.  Dispense: 30 tablet; Refill: 0 - CMP14+EGFR - CBC with Differential/Platelet  2. GAD (generalized anxiety disorder) - citalopram (CELEXA) 40 MG tablet; Take 1 tablet (40 mg total) by mouth daily.  Dispense: 90 tablet; Refill: 3 - CMP14+EGFR - CBC with Differential/Platelet  3. Primary insomnia - traZODone (DESYREL) 50 MG tablet; Take 1 tablet (50 mg total) by mouth at bedtime as needed for sleep.  Dispense: 30 tablet; Refill: 0 - CMP14+EGFR - CBC with Differential/Platelet  4. Overweight (BMI 25.0-29.9) - CMP14+EGFR - CBC with Differential/Platelet  5. Bilateral low back pain without sciatica, unspecified chronicity - cyclobenzaprine (FLEXERIL) 10 MG tablet; Take 1 tablet (10 mg total) by mouth 3 (three) times daily as needed for muscle spasms.  Dispense: 30 tablet; Refill: 0 - CMP14+EGFR - CBC with Differential/Platelet  6. Insomnia, unspecified type - CMP14+EGFR - CBC with Differential/Platelet  7. Annual physical exam - Hepatitis C antibody - CMP14+EGFR - CBC with Differential/Platelet - Lipid panel - TSH  8. Need for hepatitis C screening test - Hepatitis C antibody - CMP14+EGFR - CBC with Differential/Platelet  9. Post-menopause - DG [facility] DEXA   Labs pending Health Maintenance reviewed Diet and exercise encouraged  Follow up  plan: 1 year    Evelina Dun, FNP

## 2021-06-21 LAB — CMP14+EGFR
ALT: 42 IU/L — ABNORMAL HIGH (ref 0–32)
AST: 33 IU/L (ref 0–40)
Albumin/Globulin Ratio: 1.4 (ref 1.2–2.2)
Albumin: 4.1 g/dL (ref 3.8–4.8)
Alkaline Phosphatase: 107 IU/L (ref 44–121)
BUN/Creatinine Ratio: 8 — ABNORMAL LOW (ref 12–28)
BUN: 8 mg/dL (ref 8–27)
Bilirubin Total: 0.9 mg/dL (ref 0.0–1.2)
CO2: 23 mmol/L (ref 20–29)
Calcium: 9.6 mg/dL (ref 8.7–10.3)
Chloride: 101 mmol/L (ref 96–106)
Creatinine, Ser: 0.97 mg/dL (ref 0.57–1.00)
Globulin, Total: 3 g/dL (ref 1.5–4.5)
Glucose: 107 mg/dL — ABNORMAL HIGH (ref 65–99)
Potassium: 4.3 mmol/L (ref 3.5–5.2)
Sodium: 139 mmol/L (ref 134–144)
Total Protein: 7.1 g/dL (ref 6.0–8.5)
eGFR: 64 mL/min/{1.73_m2} (ref >59)

## 2021-06-21 LAB — LIPID PANEL
Chol/HDL Ratio: 5.7 ratio — ABNORMAL HIGH (ref 0.0–4.4)
Cholesterol, Total: 279 mg/dL — ABNORMAL HIGH (ref 100–199)
HDL: 49 mg/dL (ref >39)
LDL Chol Calc (NIH): 202 mg/dL — ABNORMAL HIGH (ref 0–99)
Triglycerides: 152 mg/dL — ABNORMAL HIGH (ref 0–149)
VLDL Cholesterol Cal: 28 mg/dL (ref 5–40)

## 2021-06-21 LAB — CBC WITH DIFFERENTIAL/PLATELET
Basophils Absolute: 0.1 10*3/uL (ref 0.0–0.2)
Basos: 1 %
EOS (ABSOLUTE): 0.3 10*3/uL (ref 0.0–0.4)
Eos: 5 %
Hematocrit: 40.6 % (ref 34.0–46.6)
Hemoglobin: 13.3 g/dL (ref 11.1–15.9)
Immature Grans (Abs): 0 10*3/uL (ref 0.0–0.1)
Immature Granulocytes: 0 %
Lymphocytes Absolute: 2.2 10*3/uL (ref 0.7–3.1)
Lymphs: 35 %
MCH: 27.5 pg (ref 26.6–33.0)
MCHC: 32.8 g/dL (ref 31.5–35.7)
MCV: 84 fL (ref 79–97)
Monocytes Absolute: 0.4 10*3/uL (ref 0.1–0.9)
Monocytes: 7 %
Neutrophils Absolute: 3.2 10*3/uL (ref 1.4–7.0)
Neutrophils: 52 %
Platelets: 290 10*3/uL (ref 150–450)
RBC: 4.84 x10E6/uL (ref 3.77–5.28)
RDW: 16 % — ABNORMAL HIGH (ref 11.7–15.4)
WBC: 6.3 10*3/uL (ref 3.4–10.8)

## 2021-06-21 LAB — TSH: TSH: 2.05 u[IU]/mL (ref 0.450–4.500)

## 2021-06-21 LAB — HEPATITIS C ANTIBODY: Hep C Virus Ab: <0.1 s/co ratio (ref 0.0–0.9)

## 2021-06-23 ENCOUNTER — Other Ambulatory Visit: Payer: Self-pay | Admitting: Family

## 2021-06-23 MED ORDER — ROSUVASTATIN CALCIUM 5 MG PO TABS: 5.0000 mg | ORAL_TABLET | Freq: Every day | ORAL | 3 refills | Status: DC

## 2021-07-30 ENCOUNTER — Other Ambulatory Visit: Payer: Self-pay | Admitting: Family

## 2021-07-30 DIAGNOSIS — F5101 Primary insomnia: Secondary | ICD-10-CM

## 2021-07-30 DIAGNOSIS — F320 Major depressive disorder, single episode, mild: Secondary | ICD-10-CM

## 2021-07-30 NOTE — Telephone Encounter (Signed)
Last office visit for physical 06/20/21 Last refill 06/20/21, #30, no refills

## 2021-09-07 ENCOUNTER — Other Ambulatory Visit: Payer: Self-pay | Admitting: Family Medicine

## 2021-09-07 DIAGNOSIS — F5101 Primary insomnia: Secondary | ICD-10-CM

## 2021-09-07 DIAGNOSIS — F320 Major depressive disorder, single episode, mild: Secondary | ICD-10-CM

## 2021-09-18 ENCOUNTER — Other Ambulatory Visit: Payer: Self-pay

## 2021-09-18 ENCOUNTER — Ambulatory Visit (INDEPENDENT_AMBULATORY_CARE_PROVIDER_SITE_OTHER): Payer: Medicare Other | Admitting: Family

## 2021-09-18 ENCOUNTER — Other Ambulatory Visit (HOSPITAL_COMMUNITY)
Admission: RE | Admit: 2021-09-18 | Discharge: 2021-09-18 | Disposition: A | Discharge status: Home / Self Care | Payer: Medicare Other | Source: Ambulatory Visit | Attending: Family | Admitting: Family

## 2021-09-18 ENCOUNTER — Encounter: Payer: Self-pay | Admitting: Family

## 2021-09-18 VITALS — BP 134/76 | HR 62 | Temp 97.0°F | Ht 69.0 in | Wt 196.8 lb

## 2021-09-18 DIAGNOSIS — D229 Melanocytic nevi, unspecified: Secondary | ICD-10-CM

## 2021-09-18 DIAGNOSIS — L989 Disorder of the skin and subcutaneous tissue, unspecified: Secondary | ICD-10-CM | POA: Diagnosis not present

## 2021-09-18 DIAGNOSIS — L821 Other seborrheic keratosis: Secondary | ICD-10-CM | POA: Diagnosis not present

## 2021-09-18 NOTE — Progress Notes (Signed)
   Subjective:    Patient ID: Catherine [name], female    DOB: August 28, 1956, 65 y.o.   MRN: [id number]  Chief Complaint  Patient presents with   Nevus    ON BACK THEY HUSBAND JUST NOTICED AND HE NEVER SEEN IT BEFORE     HPI Pt presents to the office today for a skin lesion on her back. Her husband saw it yesterday and was worried, because he had never seen it. Denies any fever, itching, pain, discharge, or erythemas.   Review of Systems  Skin:        Skin lesion  All other systems reviewed and are negative.     Objective:   Physical Exam Vitals reviewed.  Constitutional:      General: She is not in acute distress.    Appearance: She is well-developed.  HENT:     Head: Normocephalic and atraumatic.  Eyes:     Pupils: Pupils are equal, round, and reactive to light.  Neck:     Thyroid: No thyromegaly.  Cardiovascular:     Rate and Rhythm: Normal rate and regular rhythm.     Heart sounds: Normal heart sounds. No murmur heard. Pulmonary:     Effort: Pulmonary effort is normal. No respiratory distress.     Breath sounds: Normal breath sounds. No wheezing.  Abdominal:     General: Bowel sounds are normal. There is no distension.     Palpations: Abdomen is soft.     Tenderness: There is no abdominal tenderness.  Musculoskeletal:        General: No tenderness. Normal range of motion.     Cervical back: Normal range of motion and neck supple.  Skin:    General: Skin is warm and dry.     Findings: Lesion present.          Comments: Irregular border nevus and darker spots that is approx 1.8X1.7 cm  Neurological:     Mental Status: She is alert and oriented to person, place, and time.     Cranial Nerves: No cranial nerve deficit.     Deep Tendon Reflexes: Reflexes are normal and symmetric.  Psychiatric:        Behavior: Behavior normal.        Thought Content: Thought content normal.        Judgment: Judgment normal.    BP 134/76   Pulse 62   Temp (!) 97 F (36.1 C)  (Temporal)   Ht 5\' 9"  (1.753 m)   Wt 196 lb 12.8 oz (89.3 kg)   BMI 29.06 kg/m   Verbal consent given by patient. Local anesthesia Lidocaine 2% with 47ml Betadine prep Skin shave Skin lesion removed  Pressure Dressing applied Pt tolerated well      Assessment & Plan:  Catherine [name] comes in today with chief complaint of Nevus (ON BACK THEY HUSBAND JUST NOTICED AND HE NEVER SEEN IT BEFORE )   Diagnosis and orders addressed:  1. Nevus  2. Skin lesion - Surgical pathology  Keep pressure dressing intact Report any bleeding or s/s of infection   Evelina Dun, FNP

## 2021-09-18 NOTE — Patient Instructions (Signed)
Mole Excision Mole excision is a procedure to remove (excise) a mole from your skin. Most moles are noncancerous (are benign) and do not require treatment. Some moles are larger than usual or look like cancerous moles (atypical moles). You may have a mole excision if: You have an atypical mole that your health care provider thinks should be looked at under a microscope to see if it is cancerous (biopsy). You have a mole that is causing pain. You have a mole that you want removed because you do not like the way it looks. Tell a health care provider about: Any allergies you have. All medicines you are taking, including vitamins, herbs, eye drops, creams, and over-the-counter medicines. Any problems you or family members have had with anesthetic medicines. Any blood disorders you have. Any surgeries you have had. Any medical conditions you have. Whether you are pregnant or may be pregnant. What are the risks? Generally, this is a safe procedure. However, problems may occur, including: Excessive bleeding. Infection. Scarring. Allergic reactions to medicines. Damage to the skin or other tissues around the mole. What happens before the procedure? Ask your health care provider what steps will be taken to help prevent infection. These may include: Removing hair around the mole. Washing skin with germ-killing soap. What happens during the procedure?   You will be given a medicine to numb the area (local anesthetic). Your health care provider will outline the mole with ink and mark the center with a dot. This will serve as a guide during the procedure. Depending on the size of your mole, your health care provider will remove it using: A surgical blade. The mole will be cut out or shaved off (shave excision). A hollow tube with a sharp end (punch device). This may be used for larger moles. Your health care provider may use stitches (sutures) to close the wound in the skin where the mole was  removed (excision site). A bandage (dressing) may be applied over the area. The procedure may vary among health care providers and hospitals. What can I expect after procedure? Your blood pressure, heart rate, breathing rate, and blood oxygen level will be monitored until you leave the hospital or clinic. You may return to your normal activities as told by your health care provider. It is up to you to get any test results. If a sample will be tested in a lab, ask your health care provider or the department that is doing the procedure when your results will be ready. Talk with your health care provider about what your results mean. After the procedure, it is common to have: Mild pain. Your pain may increase as the anesthetic medicine wears off. Mild redness and swelling. Follow these instructions at home: Incision care  Follow instructions from your health care provider about how to take care of your incision. Make sure you: Wash your hands with soap and water before you change your bandage (dressing). If soap and water are not available, use hand sanitizer. Change your dressing as told by your health care provider. Leave stitches (sutures), skin glue, or adhesive strips in place. These skin closures may need to stay in place for 2 weeks or longer. If adhesive strip edges start to loosen and curl up, you may trim the loose edges. Do not remove adhesive strips completely unless your health care provider tells you to do that. Check your incision area every day for signs of infection. Check for: More redness, swelling, or pain. Fluid or blood.  Warmth Pus or a bad smell. General instructions Take over-the-counter and prescription medicines only as told by your health care provider. Follow instructions from your health care provider about how to minimize scarring. Avoid sun exposure until the area has healed. Scarring should lessen over time. To help prevent scarring, make sure to cover your  wound with sunscreen of at least SPF 30 after the wound has healed and all skin closures have been removed or fallen off. Keep all follow-up visits as told by your health care provider. This is important. Contact a health care provider if: A mole grows back in the same place where a mole had been removed. You have a fever. You have more redness, swelling, or pain at the incision site. You have fluid or blood coming from your incision site. Your incision site feels warm to the touch. You have pus or a bad smell coming from your incision site. Your incision site feels numb for several days after the procedure. Summary Mole excision is a procedure to remove (excise) a mole from your skin. You will be given a medicine to numb the area (local anesthetic) during the procedure to remove the mole. After the procedure, it is common to have mild pain and redness. Wash your hands with soap and water before you change your bandage (dressing). If soap and water are not available, use hand sanitizer. Contact your health care provider if you have problems or questions. This information is not intended to replace advice given to you by your health care provider. Make sure you discuss any questions you have with your health care provider. Document Revised: 09/25/2020 Document Reviewed: 09/25/2020 Elsevier Patient Education  2022 [employer].

## 2021-09-22 LAB — SURGICAL PATHOLOGY

## 2021-10-07 ENCOUNTER — Ambulatory Visit (INDEPENDENT_AMBULATORY_CARE_PROVIDER_SITE_OTHER): Payer: Medicare Other | Admitting: Family

## 2021-10-07 ENCOUNTER — Encounter: Payer: Self-pay | Admitting: Family

## 2021-10-07 DIAGNOSIS — R110 Nausea: Secondary | ICD-10-CM | POA: Diagnosis not present

## 2021-10-07 DIAGNOSIS — F411 Generalized anxiety disorder: Secondary | ICD-10-CM

## 2021-10-07 DIAGNOSIS — F320 Major depressive disorder, single episode, mild: Secondary | ICD-10-CM | POA: Diagnosis not present

## 2021-10-07 DIAGNOSIS — F4321 Adjustment disorder with depressed mood: Secondary | ICD-10-CM | POA: Diagnosis not present

## 2021-10-07 DIAGNOSIS — G4700 Insomnia, unspecified: Secondary | ICD-10-CM | POA: Diagnosis not present

## 2021-10-07 MED ORDER — ONDANSETRON HCL 4 MG PO TABS: 4.0000 mg | ORAL_TABLET | Freq: Three times a day (TID) | ORAL | 0 refills | Status: DC | PRN

## 2021-10-07 MED ORDER — BUSPIRONE HCL 5 MG PO TABS: 5.0000 mg | ORAL_TABLET | Freq: Three times a day (TID) | ORAL | 1 refills | Status: DC | PRN

## 2021-10-07 NOTE — Patient Instructions (Signed)
Managing Loss, Adult People experience loss in many different ways throughout their lives. Events such as moving, changing jobs, and losing friends can create a sense of loss. The loss may be as serious as a major health change, divorce, death of a pet, or death of a loved one. All of these types of loss are likely to create a physical and emotional reaction known as grief. Grief is the result of a major change or an absence of something or someone that you count on. Grief is a normal reaction to loss. A variety of factors can affect your grieving experience, including: The nature of your loss. Your relationship to what or whom you lost. Your understanding of grief and how to manage it. Your support system. How to manage lifestyle changes Keep to your normal routine as much as possible. If you have trouble focusing or doing normal activities, it is acceptable to take some time away from your normal routine. Spend time with friends and loved ones. Eat a healthy diet, get plenty of sleep, and rest when you feel tired. How to recognize changes  The way that you deal with your grief will affect your ability to function as you normally do. When grieving, you may experience these changes: Numbness, shock, sadness, anxiety, anger, denial, and guilt. Thoughts about death. Unexpected crying. A physical sensation of emptiness in your stomach. Problems sleeping and eating. Tiredness (fatigue). Loss of interest in normal activities. Dreaming about or imagining seeing the person who died. A need to remember what or whom you lost. Difficulty thinking about anything other than your loss for a period of time. Relief. If you have been expecting the loss for a while, you may feel a sense of relief when it happens. Follow these instructions at home: Activity Express your feelings in healthy ways, such as: Talking with others about your loss. It may be helpful to find others who have had a similar loss, such  as a support group. Writing down your feelings in a journal. Doing physical activities to release stress and emotional energy. Doing creative activities like painting, sculpting, or playing or listening to music. Practicing resilience. This is the ability to recover and adjust after facing challenges. Reading some resources that encourage resilience may help you to learn ways to practice those behaviors.  General instructions Be patient with yourself and others. Allow the grieving process to happen, and remember that grieving takes time. It is likely that you may never feel completely done with some grief. You may find a way to move on while still cherishing memories and feelings about your loss. Accepting your loss is a process. It can take months or longer to adjust. Keep all follow-up visits as told by your health care provider. This is important. Where to find support To get support for managing loss: Ask your health care provider for help and recommendations, such as grief counseling or therapy. Think about joining a support group for people who are managing a loss. Where to find more information You can find more information about managing loss from: American Society of Clinical Oncology: www.cancer.net American Psychological Association: www.apa.org Contact a health care provider if: Your grief is extreme and keeps getting worse. You have ongoing grief that does not improve. Your body shows symptoms of grief, such as illness. You feel depressed, anxious, or lonely. Get help right away if: You have thoughts about hurting yourself or others. If you ever feel like you may hurt yourself or others, or have   thoughts about taking your own life, get help right away. You can go to your nearest emergency department or call: Your local emergency services (911 in the [place] A suicide crisis helpline, such as the National Suicide Prevention Lifeline at 1-800-273-8255. This is open 24 hours a  day. Summary Grief is the result of a major change or an absence of someone or something that you count on. Grief is a normal reaction to loss. The depth of grief and the period of recovery depend on the type of loss and your ability to adjust to the change and process your feelings. Processing grief requires patience and a willingness to accept your feelings and talk about your loss with people who are supportive. It is important to find resources that work for you and to realize that people experience grief differently. There is not one grieving process that works for everyone in the same way. Be aware that when grief becomes extreme, it can lead to more severe issues like isolation, depression, anxiety, or suicidal thoughts. Talk with your health care provider if you have any of these issues. This information is not intended to replace advice given to you by your health care provider. Make sure you discuss any questions you have with your health care provider. Document Revised: 05/23/2020 Document Reviewed: 05/23/2020 Elsevier Patient Education  2022 Elsevier Inc.  

## 2021-10-07 NOTE — Progress Notes (Signed)
Virtual Visit Consent   Ellary Wesolowski, you are scheduled for a virtual visit with a [facility] provider today.     Just as with appointments in the office, your consent must be obtained to participate.  Your consent will be active for this visit and any virtual visit you may have with one of our providers in the next 365 days.     If you have a MyChart account, a copy of this consent can be sent to you electronically.  All virtual visits are billed to your insurance company just like a traditional visit in the office.    As this is a virtual visit, video technology does not allow for your provider to perform a traditional examination.  This may limit your provider's ability to fully assess your condition.  If your provider identifies any concerns that need to be evaluated in person or the need to arrange testing (such as labs, EKG, etc.), we will make arrangements to do so.     Although advances in technology are sophisticated, we cannot ensure that it will always work on either your end or our end.  If the connection with a video visit is poor, the visit may have to be switched to a telephone visit.  With either a video or telephone visit, we are not always able to ensure that we have a secure connection.     I need to obtain your verbal consent now.   Are you willing to proceed with your visit today?    Thetis Russom has provided verbal consent on 10/07/2021 for a virtual visit (video or telephone).   Evelina Dun, FNP   Date: 10/07/2021 2:04 PM   Virtual Visit via Video Note   I, Evelina Dun, connected with  Catherine [name]  ([id number], [birth year]) on 10/07/21 at  1:55 PM EDT by a video-enabled telemedicine application and verified that I am speaking with the correct person using two identifiers.  Location: Patient: Virtual Visit Location Patient: Home Provider: Virtual Visit Location Provider: Home   I discussed the limitations of evaluation and management by telemedicine and the  availability of in person appointments. The patient expressed understanding and agreed to proceed.    History of Present Illness: Jadore Mcguffin is a 65 y.o. who identifies as a female who was assigned female at birth, and is being seen today for nausea and grief. She found her husband dead last week and has had a lot of increased anxiety.   HPI: Anxiety Presents for follow-up visit. Symptoms include excessive worry, insomnia, irritability, nervous/anxious behavior, obsessions, palpitations and restlessness. Symptoms occur constantly. The severity of symptoms is moderate. The quality of sleep is good.    Depression        This is a chronic problem.  The current episode started more than 1 year ago.   The problem occurs constantly.  Associated symptoms include fatigue, helplessness, hopelessness, insomnia, irritable, restlessness, decreased interest, appetite change and sad.  Past treatments include SSRIs - Selective serotonin reuptake inhibitors.  Compliance with treatment is good.  Past medical history includes anxiety.   Insomnia Primary symptoms: difficulty falling asleep, frequent awakening.   The current episode started more than one year. The onset quality is gradual. The problem occurs intermittently. PMH includes: depression.    Problems:  Patient Active Problem List   Diagnosis Date Noted   Back pain 06/20/2021   Insomnia 06/20/2021   GAD (generalized anxiety disorder) 11/16/2019   Depression, major, single episode, mild ([place]) 11/16/2019  Overweight (BMI 25.0-29.9) 01/21/2017    Allergies:  Allergies  Allergen Reactions   Penicillins     Swollen throat and face   Medications:  Current Outpatient Medications:    busPIRone (BUSPAR) 5 MG tablet, Take 1 tablet (5 mg total) by mouth 3 (three) times daily as needed., Disp: 90 tablet, Rfl: 1   ondansetron (ZOFRAN) 4 MG tablet, Take 1 tablet (4 mg total) by mouth every 8 (eight) hours as needed for nausea or vomiting., Disp: 20  tablet, Rfl: 0   citalopram (CELEXA) 40 MG tablet, Take 1 tablet (40 mg total) by mouth daily., Disp: 90 tablet, Rfl: 3   cyclobenzaprine (FLEXERIL) 10 MG tablet, Take 1 tablet (10 mg total) by mouth 3 (three) times daily as needed for muscle spasms., Disp: 30 tablet, Rfl: 0   rosuvastatin (CRESTOR) 5 MG tablet, Take 1 tablet (5 mg total) by mouth daily., Disp: 90 tablet, Rfl: 3   traZODone (DESYREL) 50 MG tablet, TAKE 1 TABLET BY MOUTH AT BEDTIME AS NEEDED FOR SLEEP, Disp: 90 tablet, Rfl: 1  Observations/Objective: Patient is well-developed, well-nourished in no acute distress.  Resting comfortably at home.  Head is normocephalic, atraumatic.  No labored breathing. Speech is clear and coherent with logical content.  Patient is alert and oriented at baseline.  Pt crying and emotional   Assessment and Plan: 1. GAD (generalized anxiety disorder) - busPIRone (BUSPAR) 5 MG tablet; Take 1 tablet (5 mg total) by mouth 3 (three) times daily as needed.  Dispense: 90 tablet; Refill: 1  2. Grief  3. Nausea - ondansetron (ZOFRAN) 4 MG tablet; Take 1 tablet (4 mg total) by mouth every 8 (eight) hours as needed for nausea or vomiting.  Dispense: 20 tablet; Refill: 0  4. Depression, major, single episode, mild ([facility]) - busPIRone (BUSPAR) 5 MG tablet; Take 1 tablet (5 mg total) by mouth 3 (three) times daily as needed.  Dispense: 90 tablet; Refill: 1  5. Insomnia, unspecified type Continue Celexa  Start Buspar 5 mg TID prn  Stress management  Pt plans to go to children's home next week.   Follow Up Instructions: I discussed the assessment and treatment plan with the patient. The patient was provided an opportunity to ask questions and all were answered. The patient agreed with the plan and demonstrated an understanding of the instructions.  A copy of instructions were sent to the patient via MyChart unless otherwise noted below.     The patient was advised to call back or seek an in-person  evaluation if the symptoms worsen or if the condition fails to improve as anticipated.  Time:  I spent 12 minutes with the patient via telehealth technology discussing the above problems/concerns.    Evelina Dun, FNP

## 2021-11-26 ENCOUNTER — Ambulatory Visit (INDEPENDENT_AMBULATORY_CARE_PROVIDER_SITE_OTHER): Payer: Medicare Other

## 2021-11-26 VITALS — Ht 69.0 in | Wt 190.0 lb

## 2021-11-26 DIAGNOSIS — Z0000 Encounter for general adult medical examination without abnormal findings: Secondary | ICD-10-CM | POA: Diagnosis not present

## 2021-11-26 NOTE — Progress Notes (Signed)
Subjective:   Catherine [name] is a 65 y.o. female who presents for an Initial Medicare Annual Wellness Visit.  Virtual Visit via Telephone Note  I connected with  Catherine [name] on 11/26/21 at 10:30 AM EST by telephone and verified that I am speaking with the correct person using two identifiers.  Location: Patient: Home Provider: [facility] Persons participating in the virtual visit: patient/Nurse Health Advisor   I discussed the limitations, risks, security and privacy concerns of performing an evaluation and management service by telephone and the availability of in person appointments. The patient expressed understanding and agreed to proceed.  Interactive audio and video telecommunications were attempted between this nurse and patient, however failed, due to patient having technical difficulties OR patient did not have access to video capability.  We continued and completed visit with audio only.  Some vital signs may be absent or patient reported.   Catherine [name] E Marlo Goodrich, LPN   Review of Systems     Cardiac Risk Factors include: advanced age (>26men, >38 women);dyslipidemia     Objective:    Today's Vitals   11/26/21 1011  Weight: 190 lb (86.2 kg)  Height: 5\' 9"  (1.753 m)   Body mass index is 28.06 kg/m.  Advanced Directives 11/26/2021  Does Patient Have a Medical Advance Directive? No  Would patient like information on creating a medical advance directive? No - Patient declined    Current Medications (verified) Outpatient Encounter Medications as of 11/26/2021  Medication Sig   citalopram (CELEXA) 40 MG tablet Take 1 tablet (40 mg total) by mouth daily.   cyclobenzaprine (FLEXERIL) 10 MG tablet Take 1 tablet (10 mg total) by mouth 3 (three) times daily as needed for muscle spasms.   rosuvastatin (CRESTOR) 5 MG tablet Take 1 tablet (5 mg total) by mouth daily.   traZODone (DESYREL) 50 MG tablet TAKE 1 TABLET BY MOUTH AT BEDTIME AS NEEDED FOR SLEEP   busPIRone (BUSPAR) 5 MG  tablet Take 1 tablet (5 mg total) by mouth 3 (three) times daily as needed. (Patient not taking: Reported on 11/26/2021)   ondansetron (ZOFRAN) 4 MG tablet Take 1 tablet (4 mg total) by mouth every 8 (eight) hours as needed for nausea or vomiting. (Patient not taking: Reported on 11/26/2021)   No facility-administered encounter medications on file as of 11/26/2021.    Allergies (verified) Penicillins   History: Past Medical History:  Diagnosis Date   Depression    Past Surgical History:  Procedure Laterality Date   Endometrial ablation 1994     SPINE SURGERY     TUBAL LIGATION  12/14/1986   Family History  Problem Relation Age of Onset   Diabetes Mother    Hypertension Mother    Cancer Father        lung   Hypertension Father    Social History   Socioeconomic History   Marital status: Widowed    Spouse name: Not on file   Number of children: 2   Years of education: Not on file   Highest education level: Not on file  Occupational History   Occupation: Technical sales engineer  Tobacco Use   Smoking status: Never   Smokeless tobacco: Never  Vaping Use   Vaping Use: Never used  Substance and Sexual Activity   Alcohol use: Not on file   Drug use: Never   Sexual activity: Not on file  Other Topics Concern   Not on file  Social History Narrative   Plans to move in with  her daughter in [place] in the next year   Husband passed away in late 2021-03-01   Social Determinants of Health   Financial Resource Strain: Low Risk    Difficulty of Paying Living Expenses: Not hard at all  Food Insecurity: No Food Insecurity   Worried About Charity fundraiser in the Last Year: Never true   Arboriculturist in the Last Year: Never true  Transportation Needs: No Transportation Needs   Lack of Transportation (Medical): No   Lack of Transportation (Non-Medical): No  Physical Activity: Sufficiently Active   Days of Exercise per Week: 7 days   Minutes of Exercise per Session: 30 min  Stress:  No Stress Concern Present   Feeling of Stress : Not at all  Social Connections: Moderately Integrated   Frequency of Communication with Friends and Family: More than three times a week   Frequency of Social Gatherings with Friends and Family: More than three times a week   Attends Religious Services: More than 4 times per year   Active Member of [employer] or Organizations: Yes   Attends Archivist Meetings: More than 4 times per year   Marital Status: Widowed    Tobacco Counseling Counseling given: Not Answered   Clinical Intake:  Pre-visit preparation completed: Yes  Pain : No/denies pain     BMI - recorded: 28.06 Nutritional Status: BMI 25 -29 Overweight Nutritional Risks: None Diabetes: No  How often do you need to have someone help you when you read instructions, pamphlets, or other written materials from your doctor or pharmacy?: 1 - Never  Diabetic? no  Interpreter Needed?: No  Information entered by :: Keland Peyton, LPN   Activities of Daily Living In your present state of health, do you have any difficulty performing the following activities: 11/26/2021  Hearing? N  Vision? N  Difficulty concentrating or making decisions? N  Walking or climbing stairs? N  Dressing or bathing? N  Doing errands, shopping? N  Preparing Food and eating ? N  Using the Toilet? N  In the past six months, have you accidently leaked urine? N  Do you have problems with loss of bowel control? N  Managing your Medications? N  Managing your Finances? N  Housekeeping or managing your Housekeeping? N  Some recent data might be hidden    Patient Care Team: Sharion Balloon, FNP as PCP - General (Family Medicine)  Indicate any recent Medical Services you may have received from other than [facility] providers in the past year (date may be approximate).   - smashed right index finger in car door 10/2021 - Urgent Care in [facility] - 6 sutures, bruising, fracture - has healed nicely now.      Assessment:   This is a routine wellness examination for Catherine [name].  Hearing/Vision screen Hearing Screening - Comments:: Denies hearing difficulties Vision Screening - Comments:: Wears rx glasses - up to date with annual eye exams with [facility] in [place] issues and exercise activities discussed: Current Exercise Habits: Home exercise routine, Type of exercise: walking, Time (Minutes): 30, Frequency (Times/Week): 7, Weekly Exercise (Minutes/Week): 210, Intensity: Mild, Exercise limited by: None identified   Goals Addressed             This Visit's Progress    Exercise 150 min/wk Moderate Activity         Depression Screen PHQ 2/9 Scores 11/26/2021 09/18/2021 06/20/2021 10/30/2020 11/16/2019 02/24/2019 02/15/2019  PHQ - 2 Score 2 0 0  2 0 1 2  PHQ- 9 Score 3 1 0 6 0 - 6    Fall Risk Fall Risk  11/26/2021 06/20/2021 10/30/2020 08/17/2018 07/12/2018  Falls in the past year? 0 0 0 No No  Number falls in past yr: 0 - - - -  Injury with Fall? 0 - - - -  Risk for fall due to : Medication side effect - - - -  Follow up Falls prevention discussed - - - -    FALL RISK PREVENTION PERTAINING TO THE HOME:  Any stairs in or around the home? Yes  If so, are there any without handrails? No  Home free of loose throw rugs in walkways, pet beds, electrical cords, etc? Yes  Adequate lighting in your home to reduce risk of falls? Yes   ASSISTIVE DEVICES UTILIZED TO PREVENT FALLS:  Life alert? No  Use of a cane, walker or w/c? No  Grab bars in the bathroom? Yes  Shower chair or bench in shower? Yes  Elevated toilet seat or a handicapped toilet? No   TIMED UP AND GO:  Was the test performed? No . Telephonic visit  Cognitive Function:     6CIT Screen 11/26/2021  What Year? 0 points  What month? 0 points  What time? 0 points  Count back from 20 0 points  Months in reverse 0 points  Repeat phrase 2 points  Total Score 2    Immunizations Immunization History  Administered  Date(s) Administered   Pneumococcal Conjugate-13 06/20/2021   Tdap 05/06/2021    TDAP status: Up to date  Flu Vaccine status: Declined, Education has been provided regarding the importance of this vaccine but patient still declined. Advised may receive this vaccine at local pharmacy or Health Dept. Aware to provide a copy of the vaccination record if obtained from local pharmacy or Health Dept. Verbalized acceptance and understanding.  Pneumococcal vaccine status: Up to date  Covid-19 vaccine status: Declined, Education has been provided regarding the importance of this vaccine but patient still declined. Advised may receive this vaccine at local pharmacy or Health Dept.or vaccine clinic. Aware to provide a copy of the vaccination record if obtained from local pharmacy or Health Dept. Verbalized acceptance and understanding.  Qualifies for Shingles Vaccine? Yes   Zostavax completed No   Shingrix Completed?: No.    Education has been provided regarding the importance of this vaccine. Patient has been advised to call insurance company to determine out of pocket expense if they have not yet received this vaccine. Advised may also receive vaccine at local pharmacy or Health Dept. Verbalized acceptance and understanding.  Screening Tests Health Maintenance  Topic Date Due   COVID-19 Vaccine (1) Never done   Zoster Vaccines- Shingrix (1 of 2) Never done   COLONOSCOPY (Pts 45-18yrs Insurance coverage will need to be confirmed)  09/18/2022 (Originally 10/13/1999)   MAMMOGRAM  12/27/2021   Pneumonia Vaccine 54+ Years old (2 - PPSV23 if available, else PCV20) 06/20/2022   TETANUS/TDAP  05/07/2031   DEXA SCAN  Completed   Hepatitis C Screening  Completed   HPV VACCINES  Aged Out   INFLUENZA VACCINE  Discontinued    Health Maintenance  Health Maintenance Due  Topic Date Due   COVID-19 Vaccine (1) Never done   Zoster Vaccines- Shingrix (1 of 2) Never done    Colorectal cancer screening:  Declined  Mammogram status: Completed 12/27/2020. Repeat every year  Bone Density status: Completed 06/20/2021. Results reflect: Bone density results:  OSTEOPENIA. Repeat every 2 years.  Lung Cancer Screening: (Low Dose CT Chest recommended if Age 51-80 years, 30 pack-year currently smoking OR have quit w/in 15years.) does not qualify.   Additional Screening:  Hepatitis C Screening: does qualify; Completed 06/20/2021  Vision Screening: Recommended annual ophthalmology exams for early detection of glaucoma and other disorders of the eye. Is the patient up to date with their annual eye exam?  Yes  Who is the provider or what is the name of the office in which the patient attends annual eye exams? [facility] If pt is not established with a provider, would they like to be referred to a provider to establish care? No .   Dental Screening: Recommended annual dental exams for proper oral hygiene  Community Resource Referral / Chronic Care Management: CRR required this visit?  No   CCM required this visit?  No      Plan:     I have personally reviewed and noted the following in the patients chart:   Medical and social history Use of alcohol, tobacco or illicit drugs  Current medications and supplements including opioid prescriptions. Patient is not currently taking opioid prescriptions. Functional ability and status Nutritional status Physical activity Advanced directives List of other physicians Hospitalizations, surgeries, and ER visits in previous 12 months Vitals Screenings to include cognitive, depression, and falls Referrals and appointments  In addition, I have reviewed and discussed with patient certain preventive protocols, quality metrics, and best practice recommendations. A written personalized care plan for preventive services as well as general preventive health recommendations were provided to patient.     Sandrea Hammond, LPN   [id number]   Nurse Notes:  plans to move to [facility] with daughter in the next year

## 2021-11-26 NOTE — Patient Instructions (Signed)
Catherine [name] , Thank you for taking time to come for your Medicare Wellness Visit. I appreciate your ongoing commitment to your health goals. Please review the following plan we discussed and let me know if I can assist you in the future.   Screening recommendations/referrals: Colonoscopy: Declined Mammogram: Done 12/27/2020 - Repeat annually  Bone Density: Done 06/20/2021 - Repeat every 2 years  Recommended yearly ophthalmology/optometry visit for glaucoma screening and checkup Recommended yearly dental visit for hygiene and checkup  Vaccinations: Influenza vaccine: Declined Pneumococcal vaccine: Done 06/20/2021 - due for Pneumovax Tdap vaccine: Done 05/06/2021 - Repeat in 10 years  Shingles vaccine: Due   Covid-19: Declined  Advanced directives: Please bring a copy of your health care power of attorney and living will to the office to be added to your chart at your convenience.   Conditions/risks identified: Aim for 30 minutes of exercise or brisk walking each day, drink 6-8 glasses of water and eat lots of fruits and vegetables.   Next appointment: Follow up in one year for your annual wellness visit    Preventive Care 65 Years and Older, Female Preventive care refers to lifestyle choices and visits with your health care provider that can promote health and wellness. What does preventive care include? A yearly physical exam. This is also called an annual well check. Dental exams once or twice a year. Routine eye exams. Ask your health care provider how often you should have your eyes checked. Personal lifestyle choices, including: Daily care of your teeth and gums. Regular physical activity. Eating a healthy diet. Avoiding tobacco and drug use. Limiting alcohol use. Practicing safe sex. Taking low-dose aspirin every day. Taking vitamin and mineral supplements as recommended by your health care provider. What happens during an annual well check? The services and screenings done by  your health care provider during your annual well check will depend on your age, overall health, lifestyle risk factors, and family history of disease. Counseling  Your health care provider may ask you questions about your: Alcohol use. Tobacco use. Drug use. Emotional well-being. Home and relationship well-being. Sexual activity. Eating habits. History of falls. Memory and ability to understand (cognition). Work and work Statistician. Reproductive health. Screening  You may have the following tests or measurements: Height, weight, and BMI. Blood pressure. Lipid and cholesterol levels. These may be checked every 5 years, or more frequently if you are over 81 years old. Skin check. Lung cancer screening. You may have this screening every year starting at age 41 if you have a 30-pack-year history of smoking and currently smoke or have quit within the past 15 years. Fecal occult blood test (FOBT) of the stool. You may have this test every year starting at age 68. Flexible sigmoidoscopy or colonoscopy. You may have a sigmoidoscopy every 5 years or a colonoscopy every 10 years starting at age 45. Hepatitis C blood test. Hepatitis B blood test. Sexually transmitted disease (STD) testing. Diabetes screening. This is done by checking your blood sugar (glucose) after you have not eaten for a while (fasting). You may have this done every 1-3 years. Bone density scan. This is done to screen for osteoporosis. You may have this done starting at age 58. Mammogram. This may be done every 1-2 years. Talk to your health care provider about how often you should have regular mammograms. Talk with your health care provider about your test results, treatment options, and if necessary, the need for more tests. Vaccines  Your health care provider  may recommend certain vaccines, such as: Influenza vaccine. This is recommended every year. Tetanus, diphtheria, and acellular pertussis (Tdap, Td) vaccine. You  may need a Td booster every 10 years. Zoster vaccine. You may need this after age 18. Pneumococcal 13-valent conjugate (PCV13) vaccine. One dose is recommended after age 32. Pneumococcal polysaccharide (PPSV23) vaccine. One dose is recommended after age 68. Talk to your health care provider about which screenings and vaccines you need and how often you need them. This information is not intended to replace advice given to you by your health care provider. Make sure you discuss any questions you have with your health care provider. Document Released: 12/27/2015 Document Revised: 08/19/2016 Document Reviewed: 10/01/2015 Elsevier Interactive Patient Education  2017 [place] Prevention in the Home Falls can cause injuries. They can happen to people of all ages. There are many things you can do to make your home safe and to help prevent falls. What can I do on the outside of my home? Regularly fix the edges of walkways and driveways and fix any cracks. Remove anything that might make you trip as you walk through a door, such as a raised step or threshold. Trim any bushes or trees on the path to your home. Use bright outdoor lighting. Clear any walking paths of anything that might make someone trip, such as rocks or tools. Regularly check to see if handrails are loose or broken. Make sure that both sides of any steps have handrails. Any raised decks and porches should have guardrails on the edges. Have any leaves, snow, or ice cleared regularly. Use sand or salt on walking paths during winter. Clean up any spills in your garage right away. This includes oil or grease spills. What can I do in the bathroom? Use night lights. Install grab bars by the toilet and in the tub and shower. Do not use towel bars as grab bars. Use non-skid mats or decals in the tub or shower. If you need to sit down in the shower, use a plastic, non-slip stool. Keep the floor dry. Clean up any water that spills  on the floor as soon as it happens. Remove soap buildup in the tub or shower regularly. Attach bath mats securely with double-sided non-slip rug tape. Do not have throw rugs and other things on the floor that can make you trip. What can I do in the bedroom? Use night lights. Make sure that you have a light by your bed that is easy to reach. Do not use any sheets or blankets that are too big for your bed. They should not hang down onto the floor. Have a firm chair that has side arms. You can use this for support while you get dressed. Do not have throw rugs and other things on the floor that can make you trip. What can I do in the kitchen? Clean up any spills right away. Avoid walking on wet floors. Keep items that you use a lot in easy-to-reach places. If you need to reach something above you, use a strong step stool that has a grab bar. Keep electrical cords out of the way. Do not use floor polish or wax that makes floors slippery. If you must use wax, use non-skid floor wax. Do not have throw rugs and other things on the floor that can make you trip. What can I do with my stairs? Do not leave any items on the stairs. Make sure that there are handrails on both sides  of the stairs and use them. Fix handrails that are broken or loose. Make sure that handrails are as long as the stairways. Check any carpeting to make sure that it is firmly attached to the stairs. Fix any carpet that is loose or worn. Avoid having throw rugs at the top or bottom of the stairs. If you do have throw rugs, attach them to the floor with carpet tape. Make sure that you have a light switch at the top of the stairs and the bottom of the stairs. If you do not have them, ask someone to add them for you. What else can I do to help prevent falls? Wear shoes that: Do not have high heels. Have rubber bottoms. Are comfortable and fit you well. Are closed at the toe. Do not wear sandals. If you use a stepladder: Make  sure that it is fully opened. Do not climb a closed stepladder. Make sure that both sides of the stepladder are locked into place. Ask someone to hold it for you, if possible. Clearly mark and make sure that you can see: Any grab bars or handrails. First and last steps. Where the edge of each step is. Use tools that help you move around (mobility aids) if they are needed. These include: Canes. Walkers. Scooters. Crutches. Turn on the lights when you go into a dark area. Replace any light bulbs as soon as they burn out. Set up your furniture so you have a clear path. Avoid moving your furniture around. If any of your floors are uneven, fix them. If there are any pets around you, be aware of where they are. Review your medicines with your doctor. Some medicines can make you feel dizzy. This can increase your chance of falling. Ask your doctor what other things that you can do to help prevent falls. This information is not intended to replace advice given to you by your health care provider. Make sure you discuss any questions you have with your health care provider. Document Released: 09/26/2009 Document Revised: 05/07/2016 Document Reviewed: 01/04/2015 Elsevier Interactive Patient Education  2017 [employer].

## 2021-12-16 ENCOUNTER — Other Ambulatory Visit: Payer: Self-pay | Admitting: Family Medicine

## 2021-12-16 DIAGNOSIS — F411 Generalized anxiety disorder: Secondary | ICD-10-CM

## 2021-12-16 DIAGNOSIS — F320 Major depressive disorder, single episode, mild: Secondary | ICD-10-CM

## 2022-01-29 ENCOUNTER — Other Ambulatory Visit: Payer: Self-pay | Admitting: Family

## 2022-01-29 DIAGNOSIS — F320 Major depressive disorder, single episode, mild: Secondary | ICD-10-CM

## 2022-01-29 DIAGNOSIS — F5101 Primary insomnia: Secondary | ICD-10-CM

## 2022-02-02 ENCOUNTER — Other Ambulatory Visit: Payer: Self-pay | Admitting: Family

## 2022-02-02 DIAGNOSIS — Z1231 Encounter for screening mammogram for malignant neoplasm of breast: Secondary | ICD-10-CM

## 2022-02-03 ENCOUNTER — Ambulatory Visit
Admission: RE | Admit: 2022-02-03 | Discharge: 2022-02-03 | Disposition: A | Discharge status: Home / Self Care | Payer: Medicare Other | Source: Ambulatory Visit | Attending: Family | Admitting: Family

## 2022-02-03 DIAGNOSIS — Z1231 Encounter for screening mammogram for malignant neoplasm of breast: Secondary | ICD-10-CM | POA: Diagnosis not present

## 2022-05-04 DIAGNOSIS — R8290 Unspecified abnormal findings in urine: Secondary | ICD-10-CM | POA: Diagnosis not present

## 2022-05-04 DIAGNOSIS — J029 Acute pharyngitis, unspecified: Secondary | ICD-10-CM | POA: Diagnosis not present

## 2022-05-04 DIAGNOSIS — R300 Dysuria: Secondary | ICD-10-CM | POA: Diagnosis not present

## 2022-06-02 ENCOUNTER — Other Ambulatory Visit: Payer: Self-pay | Admitting: Family

## 2022-06-02 DIAGNOSIS — F320 Major depressive disorder, single episode, mild: Secondary | ICD-10-CM

## 2022-06-02 DIAGNOSIS — F5101 Primary insomnia: Secondary | ICD-10-CM

## 2022-06-02 DIAGNOSIS — F411 Generalized anxiety disorder: Secondary | ICD-10-CM

## 2022-06-25 DIAGNOSIS — M5481 Occipital neuralgia: Secondary | ICD-10-CM | POA: Diagnosis not present

## 2022-06-25 DIAGNOSIS — M542 Cervicalgia: Secondary | ICD-10-CM | POA: Diagnosis not present

## 2022-06-25 DIAGNOSIS — M530 Cervicocranial syndrome: Secondary | ICD-10-CM | POA: Diagnosis not present

## 2022-06-25 DIAGNOSIS — M5459 Other low back pain: Secondary | ICD-10-CM | POA: Diagnosis not present

## 2022-06-25 DIAGNOSIS — M9903 Segmental and somatic dysfunction of lumbar region: Secondary | ICD-10-CM | POA: Diagnosis not present

## 2022-06-25 DIAGNOSIS — M9901 Segmental and somatic dysfunction of cervical region: Secondary | ICD-10-CM | POA: Diagnosis not present

## 2022-06-26 DIAGNOSIS — M5459 Other low back pain: Secondary | ICD-10-CM | POA: Diagnosis not present

## 2022-06-26 DIAGNOSIS — M530 Cervicocranial syndrome: Secondary | ICD-10-CM | POA: Diagnosis not present

## 2022-06-26 DIAGNOSIS — M9901 Segmental and somatic dysfunction of cervical region: Secondary | ICD-10-CM | POA: Diagnosis not present

## 2022-06-26 DIAGNOSIS — M5481 Occipital neuralgia: Secondary | ICD-10-CM | POA: Diagnosis not present

## 2022-06-26 DIAGNOSIS — M542 Cervicalgia: Secondary | ICD-10-CM | POA: Diagnosis not present

## 2022-06-29 ENCOUNTER — Encounter: Payer: Self-pay | Admitting: Family

## 2022-06-29 ENCOUNTER — Ambulatory Visit (INDEPENDENT_AMBULATORY_CARE_PROVIDER_SITE_OTHER): Payer: Medicare Other | Admitting: Family

## 2022-06-29 VITALS — BP 135/66 | HR 70 | Temp 97.2°F | Ht 69.0 in | Wt 197.4 lb

## 2022-06-29 DIAGNOSIS — Z0000 Encounter for general adult medical examination without abnormal findings: Secondary | ICD-10-CM

## 2022-06-29 DIAGNOSIS — F320 Major depressive disorder, single episode, mild: Secondary | ICD-10-CM | POA: Diagnosis not present

## 2022-06-29 DIAGNOSIS — Z0001 Encounter for general adult medical examination with abnormal findings: Secondary | ICD-10-CM

## 2022-06-29 DIAGNOSIS — F411 Generalized anxiety disorder: Secondary | ICD-10-CM | POA: Diagnosis not present

## 2022-06-29 DIAGNOSIS — M530 Cervicocranial syndrome: Secondary | ICD-10-CM | POA: Diagnosis not present

## 2022-06-29 DIAGNOSIS — M5481 Occipital neuralgia: Secondary | ICD-10-CM | POA: Diagnosis not present

## 2022-06-29 DIAGNOSIS — G8929 Other chronic pain: Secondary | ICD-10-CM | POA: Diagnosis not present

## 2022-06-29 DIAGNOSIS — M9901 Segmental and somatic dysfunction of cervical region: Secondary | ICD-10-CM | POA: Diagnosis not present

## 2022-06-29 DIAGNOSIS — M542 Cervicalgia: Secondary | ICD-10-CM | POA: Diagnosis not present

## 2022-06-29 DIAGNOSIS — M5459 Other low back pain: Secondary | ICD-10-CM | POA: Diagnosis not present

## 2022-06-29 DIAGNOSIS — M545 Low back pain, unspecified: Secondary | ICD-10-CM | POA: Diagnosis not present

## 2022-06-29 DIAGNOSIS — F5101 Primary insomnia: Secondary | ICD-10-CM

## 2022-06-29 DIAGNOSIS — E663 Overweight: Secondary | ICD-10-CM

## 2022-06-29 MED ORDER — DULOXETINE HCL 60 MG PO CPEP: 60.0000 mg | ORAL_CAPSULE | Freq: Every day | ORAL | 1 refills | Status: DC

## 2022-06-29 MED ORDER — TRAZODONE HCL 50 MG PO TABS: 50.0000 mg | ORAL_TABLET | Freq: Every evening | ORAL | 0 refills | Status: DC | PRN

## 2022-06-29 MED ORDER — BUSPIRONE HCL 5 MG PO TABS: 5.0000 mg | ORAL_TABLET | Freq: Three times a day (TID) | ORAL | 0 refills | Status: DC | PRN

## 2022-06-29 NOTE — Progress Notes (Signed)
Subjective:    Patient ID: Catherine [name], female    DOB: [birth year], 66 y.o.   MRN: [id number]  Chief Complaint  Patient presents with   skin check   Medication Refill   Pt presents to the office today for CPE and chronic follow up. Her husband died in 10-24-22 and she is still dealing with this.  Depression        This is a chronic problem.  The current episode started more than 1 year ago.   The onset quality is gradual.   The problem occurs intermittently.  Associated symptoms include helplessness, insomnia, decreased interest and sad.  Associated symptoms include no fatigue, no hopelessness and not irritable.  Past treatments include SSRIs - Selective serotonin reuptake inhibitors.  Past medical history includes anxiety.   Anxiety Presents for follow-up visit. Symptoms include depressed mood, excessive worry, insomnia, irritability and nervous/anxious behavior. Symptoms occur occasionally. The severity of symptoms is moderate.    Insomnia Primary symptoms: difficulty falling asleep, frequent awakening, no premature morning awakening.   The current episode started more than one year. The onset quality is gradual. The problem occurs intermittently. PMH includes: depression.   Back Pain This is a chronic problem. The current episode started more than 1 year ago. The problem occurs intermittently. The problem has been waxing and waning since onset. The pain is present in the lumbar spine. The pain is at a severity of 6/10 (0 now, but a6 when it flares up).      Review of Systems  Constitutional:  Positive for irritability. Negative for fatigue.  Musculoskeletal:  Positive for back pain.  Psychiatric/Behavioral:  Positive for depression. The patient is nervous/anxious and has insomnia.   All other systems reviewed and are negative.  Family History  Problem Relation Age of Onset   Diabetes Mother    Hypertension Mother    Cancer Father        lung   Hypertension Father    Social  History   Socioeconomic History   Marital status: Widowed    Spouse name: Not on file   Number of children: 2   Years of education: Not on file   Highest education level: Not on file  Occupational History   Occupation: Technical sales engineer  Tobacco Use   Smoking status: Never   Smokeless tobacco: Never  Vaping Use   Vaping Use: Never used  Substance and Sexual Activity   Alcohol use: Not on file   Drug use: Never   Sexual activity: Not on file  Other Topics Concern   Not on file  Social History Narrative   Plans to move in with her daughter in [place] in the next year   Husband passed away in late 02/24/2021   Social Determinants of Health   Financial Resource Strain: [place]  (11/26/2021)   Overall Financial Resource Strain (CARDIA)    Difficulty of Paying Living Expenses: Not hard at all  Food Insecurity: No Food Insecurity (11/26/2021)   Hunger Vital Sign    Worried About Running Out of Food in the Last Year: Never true    [place] in the Last Year: Never true  Transportation Needs: No Transportation Needs (11/26/2021)   PRAPARE - Hydrologist (Medical): No    Lack of Transportation (Non-Medical): No  Physical Activity: Sufficiently Active (11/26/2021)   Exercise Vital Sign    Days of Exercise per Week: 7 days    Minutes of  Exercise per Session: 30 min  Stress: No Stress Concern Present (11/26/2021)   [place]    Feeling of Stress : Not at all  Social Connections: Moderately Integrated (11/26/2021)   Social Connection and Isolation Panel [NHANES]    Frequency of Communication with Friends and Family: More than three times a week    Frequency of Social Gatherings with Friends and Family: More than three times a week    Attends Religious Services: More than 4 times per year    Active Member of [employer] or Organizations: Yes    Attends Archivist Meetings: More than 4  times per year    Marital Status: Widowed       Objective:   Physical Exam Vitals reviewed.  Constitutional:      General: She is not irritable.She is not in acute distress.    Appearance: She is well-developed.  HENT:     Head: Normocephalic and atraumatic.     Right Ear: Tympanic membrane normal.     Left Ear: Tympanic membrane normal.  Eyes:     Pupils: Pupils are equal, round, and reactive to light.  Neck:     Thyroid: No thyromegaly.  Cardiovascular:     Rate and Rhythm: Normal rate and regular rhythm.     Heart sounds: Normal heart sounds. No murmur heard. Pulmonary:     Effort: Pulmonary effort is normal. No respiratory distress.     Breath sounds: Normal breath sounds. No wheezing.  Abdominal:     General: Bowel sounds are normal. There is no distension.     Palpations: Abdomen is soft.     Tenderness: There is no abdominal tenderness.  Musculoskeletal:        General: No tenderness. Normal range of motion.     Cervical back: Normal range of motion and neck supple.  Skin:    General: Skin is warm and dry.  Neurological:     Mental Status: She is alert and oriented to person, place, and time.     Cranial Nerves: No cranial nerve deficit.     Deep Tendon Reflexes: Reflexes are normal and symmetric.  Psychiatric:        Mood and Affect: Affect is tearful.        Behavior: Behavior normal.        Thought Content: Thought content normal.        Judgment: Judgment normal.       BP 135/66   Pulse 70   Temp (!) 97.2 F (36.2 C)   Ht 5' 9"  (1.753 m)   Wt 197 lb 6.4 oz (89.5 kg)   SpO2 97%   BMI 29.15 kg/m      Assessment & Plan:  Teri Mclure comes in today with chief complaint of skin check and Medication Refill   Diagnosis and orders addressed: 1. Depression, major, single episode, mild (HCC) Stop Celexa and start Cymbalta 60 mg  Stress management  RTO 4-6 weeks  - busPIRone (BUSPAR) 5 MG tablet; Take 1 tablet (5 mg total) by mouth 3 (three) times  daily as needed.  Dispense: 90 tablet; Refill: 0 - traZODone (DESYREL) 50 MG tablet; Take 1 tablet (50 mg total) by mouth at bedtime as needed for sleep.  Dispense: 90 tablet; Refill: 0 - DULoxetine (CYMBALTA) 60 MG capsule; Take 1 capsule (60 mg total) by mouth daily.  Dispense: 90 capsule; Refill: 1 - CMP14+EGFR - CBC with Differential/Platelet  2.  GAD (generalized anxiety disorder) - busPIRone (BUSPAR) 5 MG tablet; Take 1 tablet (5 mg total) by mouth 3 (three) times daily as needed.  Dispense: 90 tablet; Refill: 0 - DULoxetine (CYMBALTA) 60 MG capsule; Take 1 capsule (60 mg total) by mouth daily.  Dispense: 90 capsule; Refill: 1 - CMP14+EGFR - CBC with Differential/Platelet  3. Primary insomnia - traZODone (DESYREL) 50 MG tablet; Take 1 tablet (50 mg total) by mouth at bedtime as needed for sleep.  Dispense: 90 tablet; Refill: 0 - CMP14+EGFR - CBC with Differential/Platelet  4. Bilateral low back pain without sciatica, unspecified chronicity - CMP14+EGFR - CBC with Differential/Platelet  5. Overweight (BMI 25.0-29.9) - CMP14+EGFR - CBC with Differential/Platelet  6. Chronic bilateral low back pain without sciatica - CMP14+EGFR - CBC with Differential/Platelet  7. Annual physical exam - CMP14+EGFR - CBC with Differential/Platelet - Lipid panel - TSH   Labs pending Health Maintenance reviewed Diet and exercise encouraged  Follow up plan: 4-6 weeks to recheck Depression and GAD   Evelina Dun, FNP

## 2022-06-29 NOTE — Patient Instructions (Signed)
Major Depressive Disorder, Adult Major depressive disorder (MDD) is a mental health condition. It may also be called clinical depression or unipolar depression. MDD causes symptoms of sadness, hopelessness, and loss of interest in things. These symptoms last most of the day, almost every day, for 2 weeks. MDD can also cause physical symptoms. It can interfere with relationships and with everyday activities, such as work, school, and activities that are usually pleasant. MDD may be mild, moderate, or severe. It may be single-episode MDD, which happens once, or recurrent MDD, which may occur multiple times. What are the causes? The exact cause of this condition is not known. MDD is most likely caused by a combination of things, which may include: Your personality traits. Learned or conditioned behaviors or thoughts or feelings that reinforce negativity. Any alcohol or substance misuse. Long-term (chronic) physical or mental health illness. Going through a traumatic experience or major life changes. What increases the risk? The following factors may make someone more likely to develop MDD: A family history of depression. Being a woman. Troubled family relationships. Abnormally low levels of certain brain chemicals. Traumatic or painful events in childhood, especially abuse or loss of a parent. A lot of stress from life experiences, such as poor living conditions or discrimination. Chronic physical illness or other mental health disorders. What are the signs or symptoms? The main symptoms of MDD usually include: Constant depressed or irritable mood. A loss of interest in things and activities. Other symptoms include: Sleeping or eating too much or too little. Unexplained weight gain or weight loss. Tiredness or low energy. Being agitated, restless, or weak. Feeling hopeless, worthless, or guilty. Trouble thinking clearly or making decisions. Thoughts of suicide or thoughts of harming  others. Isolating oneself or avoiding other people or activities. Trouble completing tasks, work, or any normal obligations. Severe symptoms of this condition may include: Psychotic depression.This may include false beliefs, or delusions. It may also include seeing, hearing, tasting, smelling, or feeling things that are not real (hallucinations). Chronic depression or persistent depressive disorder. This is low-level depression that lasts for at least 2 years. Melancholic depression, or feeling extremely sad and hopeless. Catatonic depression, which includes trouble speaking and trouble moving. How is this diagnosed? This condition may be diagnosed based on: Your symptoms. Your medical and mental health history. You may be asked questions about your lifestyle, including any drug and alcohol use. A physical exam. Blood tests to rule out other conditions. MDD is confirmed if you have the following symptoms most of the day, nearly every day, in a 2-week period: Either a depressed mood or loss of interest. At least four other MDD symptoms. How is this treated? This condition is usually treated by mental health professionals, such as psychologists, psychiatrists, and clinical social workers. You may need more than one type of treatment. Treatment may include: Psychotherapy, also called talk therapy or counseling. Types of psychotherapy include: Cognitive behavioral therapy (CBT). This teaches you to recognize unhealthy feelings, thoughts, and behaviors, and replace them with positive thoughts and actions. Interpersonal therapy (IPT). This helps you to improve the way you communicate with others or relate to them. Family therapy. This treatment includes members of your family. Medicines to treat anxiety and depression. These medicines help to balance the brain chemicals that affect your emotions. Lifestyle changes. You may be asked to: Limit alcohol use and avoid drug use. Get regular  exercise. Get plenty of sleep. Make healthy eating choices. Spend more time outdoors. Brain stimulation. This may  be done if symptoms are very severe and other treatments have not worked. Examples of this treatment are electroconvulsive therapy and transcranial magnetic stimulation. Follow these instructions at home: Activity Exercise regularly and spend time outdoors. Find activities that you enjoy doing, and make time to do them. Find healthy ways to manage stress, such as: Meditation or deep breathing. Spending time in nature. Journaling. Return to your normal activities as told by your health care provider. Ask your health care provider what activities are safe for you. Alcohol and drug use If you drink alcohol: Limit how much you use to: 0-1 drink a day for women who are not pregnant. 0-2 drinks a day for men. Be aware of how much alcohol is in your drink. In the [place] one drink equals one 12 oz bottle of beer (355 mL), one 5 oz glass of wine (148 mL), or one 1 oz glass of hard liquor (44 mL). Discuss your alcohol use with your health care provider. Alcohol can affect any antidepressant medicines you are taking. Discuss any drug use with your health care provider. General instructions  Take over-the-counter and prescription medicines only as told by your health care provider. Eat a healthy diet and get plenty of sleep. Consider joining a support group. Your health care provider may be able to recommend one. Keep all follow-up visits as told by your health care provider. This is important. Where to find more information [employer] on Mental Illness: www.nami.[place]: https://carter.com/ Contact a health care provider if: Your symptoms get worse. You develop new symptoms. Get help right away if: You self-harm. You have serious thoughts about hurting yourself or others. You hallucinate. If you ever feel like you may hurt yourself or  others, or have thoughts about taking your own life, get help right away. Go to your nearest emergency department or: Call your local emergency services (911 in the [place] Call a suicide crisis helpline, such as the [place] at 435 726 0685 or 988 in the [place]. This is open 24 hours a day in the [place] Text the Crisis Text Line at (430) 204-1035 (in the [place].). Summary Major depressive disorder (MDD) is a mental health condition. MDD causes symptoms of sadness, hopelessness, and loss of interest in things. These symptoms last most of the day, almost every day, for 2 weeks. The symptoms of MDD can interfere with relationships and with everyday activities. Treatments and support are available for people who develop MDD. You may need more than one type of treatment. Get help right away if you have serious thoughts about hurting yourself or others. This information is not intended to replace advice given to you by your health care provider. Make sure you discuss any questions you have with your health care provider. Document Revised: 06/25/2021 Document Reviewed: 11/11/2019 Elsevier Patient Education  [place].

## 2022-06-30 ENCOUNTER — Other Ambulatory Visit: Payer: Self-pay | Admitting: Family

## 2022-06-30 LAB — CBC WITH DIFFERENTIAL/PLATELET
Basophils Absolute: 0.1 10*3/uL (ref 0.0–0.2)
Basos: 1 %
EOS (ABSOLUTE): 0.2 10*3/uL (ref 0.0–0.4)
Eos: 3 %
Hematocrit: 39.5 % (ref 34.0–46.6)
Hemoglobin: 13.3 g/dL (ref 11.1–15.9)
Immature Grans (Abs): 0 10*3/uL (ref 0.0–0.1)
Immature Granulocytes: 1 %
Lymphocytes Absolute: 2.3 10*3/uL (ref 0.7–3.1)
Lymphs: 32 %
MCH: 29 pg (ref 26.6–33.0)
MCHC: 33.7 g/dL (ref 31.5–35.7)
MCV: 86 fL (ref 79–97)
Monocytes Absolute: 0.6 10*3/uL (ref 0.1–0.9)
Monocytes: 8 %
Neutrophils Absolute: 4 10*3/uL (ref 1.4–7.0)
Neutrophils: 55 %
Platelets: 281 10*3/uL (ref 150–450)
RBC: 4.58 x10E6/uL (ref 3.77–5.28)
RDW: 12.8 % (ref 11.7–15.4)
WBC: 7.3 10*3/uL (ref 3.4–10.8)

## 2022-06-30 LAB — CMP14+EGFR
ALT: 21 IU/L (ref 0–32)
AST: 25 IU/L (ref 0–40)
Albumin/Globulin Ratio: 1.4 (ref 1.2–2.2)
Albumin: 4.1 g/dL (ref 3.9–4.9)
Alkaline Phosphatase: 111 IU/L (ref 44–121)
BUN/Creatinine Ratio: 14 (ref 12–28)
BUN: 14 mg/dL (ref 8–27)
Bilirubin Total: 0.7 mg/dL (ref 0.0–1.2)
CO2: 22 mmol/L (ref 20–29)
Calcium: 9.6 mg/dL (ref 8.7–10.3)
Chloride: 103 mmol/L (ref 96–106)
Creatinine, Ser: 0.98 mg/dL (ref 0.57–1.00)
Globulin, Total: 3 g/dL (ref 1.5–4.5)
Glucose: 111 mg/dL — ABNORMAL HIGH (ref 70–99)
Potassium: 4 mmol/L (ref 3.5–5.2)
Sodium: 139 mmol/L (ref 134–144)
Total Protein: 7.1 g/dL (ref 6.0–8.5)
eGFR: 63 mL/min/{1.73_m2} (ref >59)

## 2022-06-30 LAB — LIPID PANEL
Chol/HDL Ratio: 4.9 ratio — ABNORMAL HIGH (ref 0.0–4.4)
Cholesterol, Total: 263 mg/dL — ABNORMAL HIGH (ref 100–199)
HDL: 54 mg/dL (ref >39)
LDL Chol Calc (NIH): 192 mg/dL — ABNORMAL HIGH (ref 0–99)
Triglycerides: 99 mg/dL (ref 0–149)
VLDL Cholesterol Cal: 17 mg/dL (ref 5–40)

## 2022-06-30 LAB — TSH: TSH: 1.68 u[IU]/mL (ref 0.450–4.500)

## 2022-06-30 MED ORDER — ROSUVASTATIN CALCIUM 10 MG PO TABS: 10.0000 mg | ORAL_TABLET | Freq: Every day | ORAL | 1 refills | Status: DC

## 2022-07-01 DIAGNOSIS — M5481 Occipital neuralgia: Secondary | ICD-10-CM | POA: Diagnosis not present

## 2022-07-01 DIAGNOSIS — M9901 Segmental and somatic dysfunction of cervical region: Secondary | ICD-10-CM | POA: Diagnosis not present

## 2022-07-01 DIAGNOSIS — M530 Cervicocranial syndrome: Secondary | ICD-10-CM | POA: Diagnosis not present

## 2022-07-01 DIAGNOSIS — M542 Cervicalgia: Secondary | ICD-10-CM | POA: Diagnosis not present

## 2022-07-01 DIAGNOSIS — M5459 Other low back pain: Secondary | ICD-10-CM | POA: Diagnosis not present

## 2022-07-03 DIAGNOSIS — M5459 Other low back pain: Secondary | ICD-10-CM | POA: Diagnosis not present

## 2022-07-03 DIAGNOSIS — M9901 Segmental and somatic dysfunction of cervical region: Secondary | ICD-10-CM | POA: Diagnosis not present

## 2022-07-03 DIAGNOSIS — M542 Cervicalgia: Secondary | ICD-10-CM | POA: Diagnosis not present

## 2022-07-03 DIAGNOSIS — M530 Cervicocranial syndrome: Secondary | ICD-10-CM | POA: Diagnosis not present

## 2022-07-03 DIAGNOSIS — M5481 Occipital neuralgia: Secondary | ICD-10-CM | POA: Diagnosis not present

## 2022-07-06 DIAGNOSIS — M530 Cervicocranial syndrome: Secondary | ICD-10-CM | POA: Diagnosis not present

## 2022-07-06 DIAGNOSIS — M5459 Other low back pain: Secondary | ICD-10-CM | POA: Diagnosis not present

## 2022-07-06 DIAGNOSIS — M542 Cervicalgia: Secondary | ICD-10-CM | POA: Diagnosis not present

## 2022-07-06 DIAGNOSIS — M9901 Segmental and somatic dysfunction of cervical region: Secondary | ICD-10-CM | POA: Diagnosis not present

## 2022-07-06 DIAGNOSIS — M5481 Occipital neuralgia: Secondary | ICD-10-CM | POA: Diagnosis not present

## 2022-07-08 DIAGNOSIS — M5459 Other low back pain: Secondary | ICD-10-CM | POA: Diagnosis not present

## 2022-07-08 DIAGNOSIS — M530 Cervicocranial syndrome: Secondary | ICD-10-CM | POA: Diagnosis not present

## 2022-07-08 DIAGNOSIS — M5481 Occipital neuralgia: Secondary | ICD-10-CM | POA: Diagnosis not present

## 2022-07-08 DIAGNOSIS — M542 Cervicalgia: Secondary | ICD-10-CM | POA: Diagnosis not present

## 2022-07-08 DIAGNOSIS — M9901 Segmental and somatic dysfunction of cervical region: Secondary | ICD-10-CM | POA: Diagnosis not present

## 2022-07-10 DIAGNOSIS — M542 Cervicalgia: Secondary | ICD-10-CM | POA: Diagnosis not present

## 2022-07-10 DIAGNOSIS — M5459 Other low back pain: Secondary | ICD-10-CM | POA: Diagnosis not present

## 2022-07-10 DIAGNOSIS — M9901 Segmental and somatic dysfunction of cervical region: Secondary | ICD-10-CM | POA: Diagnosis not present

## 2022-07-10 DIAGNOSIS — M530 Cervicocranial syndrome: Secondary | ICD-10-CM | POA: Diagnosis not present

## 2022-07-10 DIAGNOSIS — M5481 Occipital neuralgia: Secondary | ICD-10-CM | POA: Diagnosis not present

## 2022-07-13 DIAGNOSIS — M9901 Segmental and somatic dysfunction of cervical region: Secondary | ICD-10-CM | POA: Diagnosis not present

## 2022-07-13 DIAGNOSIS — M5459 Other low back pain: Secondary | ICD-10-CM | POA: Diagnosis not present

## 2022-07-13 DIAGNOSIS — M542 Cervicalgia: Secondary | ICD-10-CM | POA: Diagnosis not present

## 2022-07-13 DIAGNOSIS — M5481 Occipital neuralgia: Secondary | ICD-10-CM | POA: Diagnosis not present

## 2022-07-13 DIAGNOSIS — M530 Cervicocranial syndrome: Secondary | ICD-10-CM | POA: Diagnosis not present

## 2022-07-15 DIAGNOSIS — M542 Cervicalgia: Secondary | ICD-10-CM | POA: Diagnosis not present

## 2022-07-15 DIAGNOSIS — M9901 Segmental and somatic dysfunction of cervical region: Secondary | ICD-10-CM | POA: Diagnosis not present

## 2022-07-15 DIAGNOSIS — M5459 Other low back pain: Secondary | ICD-10-CM | POA: Diagnosis not present

## 2022-07-15 DIAGNOSIS — M530 Cervicocranial syndrome: Secondary | ICD-10-CM | POA: Diagnosis not present

## 2022-07-15 DIAGNOSIS — M5481 Occipital neuralgia: Secondary | ICD-10-CM | POA: Diagnosis not present

## 2022-07-17 DIAGNOSIS — M530 Cervicocranial syndrome: Secondary | ICD-10-CM | POA: Diagnosis not present

## 2022-07-17 DIAGNOSIS — M5459 Other low back pain: Secondary | ICD-10-CM | POA: Diagnosis not present

## 2022-07-17 DIAGNOSIS — M5481 Occipital neuralgia: Secondary | ICD-10-CM | POA: Diagnosis not present

## 2022-07-17 DIAGNOSIS — M9901 Segmental and somatic dysfunction of cervical region: Secondary | ICD-10-CM | POA: Diagnosis not present

## 2022-07-17 DIAGNOSIS — M542 Cervicalgia: Secondary | ICD-10-CM | POA: Diagnosis not present

## 2022-07-20 DIAGNOSIS — M5459 Other low back pain: Secondary | ICD-10-CM | POA: Diagnosis not present

## 2022-07-20 DIAGNOSIS — M530 Cervicocranial syndrome: Secondary | ICD-10-CM | POA: Diagnosis not present

## 2022-07-20 DIAGNOSIS — M9901 Segmental and somatic dysfunction of cervical region: Secondary | ICD-10-CM | POA: Diagnosis not present

## 2022-07-20 DIAGNOSIS — M5481 Occipital neuralgia: Secondary | ICD-10-CM | POA: Diagnosis not present

## 2022-07-20 DIAGNOSIS — M542 Cervicalgia: Secondary | ICD-10-CM | POA: Diagnosis not present

## 2022-07-22 DIAGNOSIS — M9901 Segmental and somatic dysfunction of cervical region: Secondary | ICD-10-CM | POA: Diagnosis not present

## 2022-07-22 DIAGNOSIS — M530 Cervicocranial syndrome: Secondary | ICD-10-CM | POA: Diagnosis not present

## 2022-07-22 DIAGNOSIS — M5459 Other low back pain: Secondary | ICD-10-CM | POA: Diagnosis not present

## 2022-07-22 DIAGNOSIS — M542 Cervicalgia: Secondary | ICD-10-CM | POA: Diagnosis not present

## 2022-07-22 DIAGNOSIS — M5481 Occipital neuralgia: Secondary | ICD-10-CM | POA: Diagnosis not present

## 2022-07-24 DIAGNOSIS — M5459 Other low back pain: Secondary | ICD-10-CM | POA: Diagnosis not present

## 2022-07-24 DIAGNOSIS — M5481 Occipital neuralgia: Secondary | ICD-10-CM | POA: Diagnosis not present

## 2022-07-24 DIAGNOSIS — M9901 Segmental and somatic dysfunction of cervical region: Secondary | ICD-10-CM | POA: Diagnosis not present

## 2022-07-24 DIAGNOSIS — M530 Cervicocranial syndrome: Secondary | ICD-10-CM | POA: Diagnosis not present

## 2022-07-24 DIAGNOSIS — M542 Cervicalgia: Secondary | ICD-10-CM | POA: Diagnosis not present

## 2022-07-28 ENCOUNTER — Encounter: Payer: Self-pay | Admitting: Family

## 2022-07-28 ENCOUNTER — Ambulatory Visit (INDEPENDENT_AMBULATORY_CARE_PROVIDER_SITE_OTHER): Payer: Medicare Other | Admitting: Family

## 2022-07-28 VITALS — BP 116/72 | HR 87 | Temp 97.0°F | Ht 67.0 in | Wt 200.6 lb

## 2022-07-28 DIAGNOSIS — L989 Disorder of the skin and subcutaneous tissue, unspecified: Secondary | ICD-10-CM | POA: Diagnosis not present

## 2022-07-28 DIAGNOSIS — M545 Low back pain, unspecified: Secondary | ICD-10-CM

## 2022-07-28 DIAGNOSIS — F411 Generalized anxiety disorder: Secondary | ICD-10-CM

## 2022-07-28 DIAGNOSIS — F320 Major depressive disorder, single episode, mild: Secondary | ICD-10-CM | POA: Diagnosis not present

## 2022-07-28 DIAGNOSIS — G4700 Insomnia, unspecified: Secondary | ICD-10-CM | POA: Diagnosis not present

## 2022-07-28 DIAGNOSIS — G8929 Other chronic pain: Secondary | ICD-10-CM

## 2022-07-28 MED ORDER — CYCLOBENZAPRINE HCL 10 MG PO TABS: 10.0000 mg | ORAL_TABLET | Freq: Three times a day (TID) | ORAL | 1 refills | Status: DC | PRN

## 2022-07-28 NOTE — Progress Notes (Addendum)
Subjective:    Patient ID: Keelan Pomerleau, female    DOB: July 12, 1956, 66 y.o.   MRN: [id number]  Chief Complaint  Patient presents with   Follow-up    2 spots on back    Pt presents to the office today to recheck GAD and Depression. We stopped her Celexa and started Cymbalta 60 mg. She is continuing her Buspar 5 mg TID prn (usually just once a day). States this is working well.   We also increased her Crestor to 10 mg. Do well with this.  Anxiety Presents for follow-up visit. Symptoms include insomnia and nervous/anxious behavior. Patient reports no depressed mood, excessive worry, irritability or restlessness. Symptoms occur occasionally.    Depression        This is a chronic problem.  The current episode started more than 1 year ago.   The problem occurs intermittently.  Associated symptoms include helplessness, insomnia, decreased interest and sad.  Associated symptoms include no hopelessness and no restlessness.  Past treatments include SNRIs - Serotonin and norepinephrine reuptake inhibitors.  Past medical history includes anxiety.   Hyperlipidemia This is a chronic problem. The current episode started more than 1 year ago. The problem is controlled. Recent lipid tests were reviewed and are normal. Exacerbating diseases include obesity. Current antihyperlipidemic treatment includes statins. The current treatment provides moderate improvement of lipids.  Insomnia Primary symptoms: difficulty falling asleep, frequent awakening, no premature morning awakening.   The current episode started more than one year. The onset quality is gradual. The problem occurs intermittently. PMH includes: depression.   Back Pain This is a chronic problem. The current episode started more than 1 year ago. The problem has been waxing and waning since onset. The pain is present in the lumbar spine. The quality of the pain is described as aching. The pain is at a severity of 6/10. The pain is mild. She has  tried bed rest and muscle relaxant for the symptoms. The treatment provided mild relief.      Review of Systems  Constitutional:  Negative for irritability.  Musculoskeletal:  Positive for back pain.  Psychiatric/Behavioral:  Positive for depression. The patient is nervous/anxious and has insomnia.   All other systems reviewed and are negative.      Objective:   Physical Exam Vitals reviewed.  Constitutional:      General: She is not in acute distress.    Appearance: She is well-developed.  HENT:     Head: Normocephalic and atraumatic.     Right Ear: Tympanic membrane normal.     Left Ear: Tympanic membrane normal.  Eyes:     Pupils: Pupils are equal, round, and reactive to light.  Neck:     Thyroid: No thyromegaly.  Cardiovascular:     Rate and Rhythm: Normal rate and regular rhythm.     Heart sounds: Normal heart sounds. No murmur heard. Pulmonary:     Effort: Pulmonary effort is normal. No respiratory distress.     Breath sounds: Normal breath sounds. No wheezing.  Abdominal:     General: Bowel sounds are normal. There is no distension.     Palpations: Abdomen is soft.     Tenderness: There is no abdominal tenderness.  Musculoskeletal:        General: No tenderness. Normal range of motion.     Cervical back: Normal range of motion and neck supple.  Skin:    General: Skin is warm and dry.  Comments: Three skin tags present on back  Neurological:     Mental Status: She is alert and oriented to person, place, and time.     Cranial Nerves: No cranial nerve deficit.     Deep Tendon Reflexes: Reflexes are normal and symmetric.  Psychiatric:        Mood and Affect: Affect is tearful.        Behavior: Behavior normal.        Thought Content: Thought content normal.        Judgment: Judgment normal.    Area cleaned and skin tags removed. Pt tolerated well.    BP 116/72   Pulse 87   Temp (!) 97 F (36.1 C) (Temporal)   Ht '5\' 7"'$  (1.702 m)   Wt 200 lb  9.6 oz (91 kg)   BMI 31.42 kg/m       Assessment & Plan:  Noura Morella comes in today with chief complaint of Follow-up (2 spots on back )   Diagnosis and orders addressed:  1. Bilateral low back pain without sciatica, unspecified chronicity Rest Flexeril as needed  - cyclobenzaprine (FLEXERIL) 10 MG tablet; Take 1 tablet (10 mg total) by mouth 3 (three) times daily as needed for muscle spasms.  Dispense: 90 tablet; Refill: 1  2. GAD (generalized anxiety disorder)  3. Depression, major, single episode, mild ([place])  4. Insomnia, unspecified type  5. Chronic bilateral low back pain without sciatica   Continue current medications  Health Maintenance reviewed Diet and exercise encouraged  Follow up plan: 6 months    Evelina Dun, FNP

## 2022-07-28 NOTE — Patient Instructions (Signed)
Managing Loss, Adult People experience loss in many different ways throughout their lives. Events such as moving, changing jobs, and losing friends can create a sense of loss. The loss may be as serious as a major health change, divorce, death of a pet, or death of a loved one. All of these types of loss are likely to create a physical and emotional reaction known as grief. Grief is the result of a major change or an absence of something or someone that you count on. Grief is a normal reaction to loss. A variety of factors can affect your grieving experience, including: The nature of your loss. Your relationship to what or whom you lost. Your understanding of grief and how to manage it. Your support system. Be aware that when grief becomes extreme, it can lead to more severe issues like isolation, depression, anxiety, or suicidal thoughts. Talk with your health care provider if you have any of these issues. How to manage lifestyle changes Keep to your normal routine as much as possible. If you have trouble focusing or doing normal activities, it is acceptable to take some time away from your normal routine. Spend time with friends and loved ones. Eat a healthy diet, get plenty of sleep, and rest when you feel tired. How to recognize changes  The way that you deal with your grief will affect your ability to function as you normally do. When grieving, you may experience these changes: Numbness, shock, sadness, anxiety, anger, denial, and guilt. Thoughts about death. Unexpected crying. A physical sensation of emptiness in your stomach. Problems sleeping and eating. Tiredness (fatigue). Loss of interest in normal activities. Dreaming about or imagining seeing the person who died. A need to remember what or whom you lost. Difficulty thinking about anything other than your loss for a period of time. Relief. If you have been expecting the loss for a while, you may feel a sense of relief when it  happens. Follow these instructions at home: Activity Express your feelings in healthy ways, such as: Talking with others about your loss. It may be helpful to find others who have had a similar loss, such as a support group. Writing down your feelings in a journal. Doing physical activities to release stress and emotional energy. Doing creative activities like painting, sculpting, or playing or listening to music. Practicing resilience. This is the ability to recover and adjust after facing challenges. Reading some resources that encourage resilience may help you to learn ways to practice those behaviors.  General instructions Be patient with yourself and others. Allow the grieving process to happen, and remember that grieving takes time. It is likely that you may never feel completely done with some grief. You may find a way to move on while still cherishing memories and feelings about your loss. Accepting your loss is a process. It can take months or longer to adjust. Keep all follow-up visits. This is important. Where to find support To get support for managing loss: Ask your health care provider for help and recommendations, such as grief counseling or therapy. Think about joining a support group for people who are managing a loss. Where to find more information You can find more information about managing loss from: American Society of Clinical Oncology: www.cancer.net American Psychological Association: www.apa.org Contact a health care provider if: Your grief is extreme and keeps getting worse. You have ongoing grief that does not improve. Your body shows symptoms of grief, such as illness. You feel depressed, anxious, or   hopeless. Get help right away if: You have thoughts about hurting yourself or others. Get help right away if you feel like you may hurt yourself or others, or have thoughts about taking your own life. Go to your nearest emergency room or: Call 911. Call the  National Suicide Prevention Lifeline at 1-800-273-8255 or 988. This is open 24 hours a day. Text the Crisis Text Line at 741741. Summary Grief is the result of a major change or an absence of someone or something that you count on. Grief is a normal reaction to loss. The depth of grief and the period of recovery depend on the type of loss and your ability to adjust to the change and process your feelings. Processing grief requires patience and a willingness to accept your feelings and talk about your loss with people who are supportive. It is important to find resources that work for you and to realize that people experience grief differently. There is not one grieving process that works for everyone in the same way. Be aware that when grief becomes extreme, it can lead to more severe issues like isolation, depression, anxiety, or suicidal thoughts. Talk with your health care provider if you have any of these issues. This information is not intended to replace advice given to you by your health care provider. Make sure you discuss any questions you have with your health care provider. Document Revised: 07/21/2021 Document Reviewed: 07/21/2021 Elsevier Patient Education  2023 Elsevier Inc.  

## 2022-07-29 DIAGNOSIS — M530 Cervicocranial syndrome: Secondary | ICD-10-CM | POA: Diagnosis not present

## 2022-07-29 DIAGNOSIS — M9901 Segmental and somatic dysfunction of cervical region: Secondary | ICD-10-CM | POA: Diagnosis not present

## 2022-07-29 DIAGNOSIS — M5481 Occipital neuralgia: Secondary | ICD-10-CM | POA: Diagnosis not present

## 2022-07-29 DIAGNOSIS — M542 Cervicalgia: Secondary | ICD-10-CM | POA: Diagnosis not present

## 2022-07-29 DIAGNOSIS — M5459 Other low back pain: Secondary | ICD-10-CM | POA: Diagnosis not present

## 2022-07-31 DIAGNOSIS — M5459 Other low back pain: Secondary | ICD-10-CM | POA: Diagnosis not present

## 2022-07-31 DIAGNOSIS — M5481 Occipital neuralgia: Secondary | ICD-10-CM | POA: Diagnosis not present

## 2022-07-31 DIAGNOSIS — M530 Cervicocranial syndrome: Secondary | ICD-10-CM | POA: Diagnosis not present

## 2022-07-31 DIAGNOSIS — M542 Cervicalgia: Secondary | ICD-10-CM | POA: Diagnosis not present

## 2022-07-31 DIAGNOSIS — M9901 Segmental and somatic dysfunction of cervical region: Secondary | ICD-10-CM | POA: Diagnosis not present

## 2022-08-03 DIAGNOSIS — M542 Cervicalgia: Secondary | ICD-10-CM | POA: Diagnosis not present

## 2022-08-03 DIAGNOSIS — M5459 Other low back pain: Secondary | ICD-10-CM | POA: Diagnosis not present

## 2022-08-03 DIAGNOSIS — M5481 Occipital neuralgia: Secondary | ICD-10-CM | POA: Diagnosis not present

## 2022-08-03 DIAGNOSIS — M530 Cervicocranial syndrome: Secondary | ICD-10-CM | POA: Diagnosis not present

## 2022-08-03 DIAGNOSIS — M9901 Segmental and somatic dysfunction of cervical region: Secondary | ICD-10-CM | POA: Diagnosis not present

## 2022-08-07 DIAGNOSIS — M5459 Other low back pain: Secondary | ICD-10-CM | POA: Diagnosis not present

## 2022-08-07 DIAGNOSIS — M9901 Segmental and somatic dysfunction of cervical region: Secondary | ICD-10-CM | POA: Diagnosis not present

## 2022-08-07 DIAGNOSIS — M9903 Segmental and somatic dysfunction of lumbar region: Secondary | ICD-10-CM | POA: Diagnosis not present

## 2022-08-07 DIAGNOSIS — M542 Cervicalgia: Secondary | ICD-10-CM | POA: Diagnosis not present

## 2022-08-10 DIAGNOSIS — M9901 Segmental and somatic dysfunction of cervical region: Secondary | ICD-10-CM | POA: Diagnosis not present

## 2022-08-10 DIAGNOSIS — M5481 Occipital neuralgia: Secondary | ICD-10-CM | POA: Diagnosis not present

## 2022-08-10 DIAGNOSIS — M542 Cervicalgia: Secondary | ICD-10-CM | POA: Diagnosis not present

## 2022-08-10 DIAGNOSIS — M9903 Segmental and somatic dysfunction of lumbar region: Secondary | ICD-10-CM | POA: Diagnosis not present

## 2022-08-10 DIAGNOSIS — M5459 Other low back pain: Secondary | ICD-10-CM | POA: Diagnosis not present

## 2022-08-13 DIAGNOSIS — M9903 Segmental and somatic dysfunction of lumbar region: Secondary | ICD-10-CM | POA: Diagnosis not present

## 2022-08-13 DIAGNOSIS — M5481 Occipital neuralgia: Secondary | ICD-10-CM | POA: Diagnosis not present

## 2022-08-13 DIAGNOSIS — M542 Cervicalgia: Secondary | ICD-10-CM | POA: Diagnosis not present

## 2022-08-13 DIAGNOSIS — M9901 Segmental and somatic dysfunction of cervical region: Secondary | ICD-10-CM | POA: Diagnosis not present

## 2022-08-13 DIAGNOSIS — M5459 Other low back pain: Secondary | ICD-10-CM | POA: Diagnosis not present

## 2022-08-20 DIAGNOSIS — M9903 Segmental and somatic dysfunction of lumbar region: Secondary | ICD-10-CM | POA: Diagnosis not present

## 2022-08-20 DIAGNOSIS — M9901 Segmental and somatic dysfunction of cervical region: Secondary | ICD-10-CM | POA: Diagnosis not present

## 2022-08-20 DIAGNOSIS — M5459 Other low back pain: Secondary | ICD-10-CM | POA: Diagnosis not present

## 2022-08-20 DIAGNOSIS — M5481 Occipital neuralgia: Secondary | ICD-10-CM | POA: Diagnosis not present

## 2022-08-20 DIAGNOSIS — M542 Cervicalgia: Secondary | ICD-10-CM | POA: Diagnosis not present

## 2022-08-21 DIAGNOSIS — M5459 Other low back pain: Secondary | ICD-10-CM | POA: Diagnosis not present

## 2022-08-21 DIAGNOSIS — M5481 Occipital neuralgia: Secondary | ICD-10-CM | POA: Diagnosis not present

## 2022-08-21 DIAGNOSIS — M9903 Segmental and somatic dysfunction of lumbar region: Secondary | ICD-10-CM | POA: Diagnosis not present

## 2022-08-21 DIAGNOSIS — M9901 Segmental and somatic dysfunction of cervical region: Secondary | ICD-10-CM | POA: Diagnosis not present

## 2022-08-21 DIAGNOSIS — M542 Cervicalgia: Secondary | ICD-10-CM | POA: Diagnosis not present

## 2022-08-25 DIAGNOSIS — M9901 Segmental and somatic dysfunction of cervical region: Secondary | ICD-10-CM | POA: Diagnosis not present

## 2022-08-25 DIAGNOSIS — M542 Cervicalgia: Secondary | ICD-10-CM | POA: Diagnosis not present

## 2022-08-25 DIAGNOSIS — M5459 Other low back pain: Secondary | ICD-10-CM | POA: Diagnosis not present

## 2022-08-25 DIAGNOSIS — M5481 Occipital neuralgia: Secondary | ICD-10-CM | POA: Diagnosis not present

## 2022-08-25 DIAGNOSIS — M9903 Segmental and somatic dysfunction of lumbar region: Secondary | ICD-10-CM | POA: Diagnosis not present

## 2022-08-28 DIAGNOSIS — M542 Cervicalgia: Secondary | ICD-10-CM | POA: Diagnosis not present

## 2022-08-28 DIAGNOSIS — M5459 Other low back pain: Secondary | ICD-10-CM | POA: Diagnosis not present

## 2022-08-28 DIAGNOSIS — M5481 Occipital neuralgia: Secondary | ICD-10-CM | POA: Diagnosis not present

## 2022-08-28 DIAGNOSIS — M9903 Segmental and somatic dysfunction of lumbar region: Secondary | ICD-10-CM | POA: Diagnosis not present

## 2022-08-28 DIAGNOSIS — M9901 Segmental and somatic dysfunction of cervical region: Secondary | ICD-10-CM | POA: Diagnosis not present

## 2022-08-31 DIAGNOSIS — M5459 Other low back pain: Secondary | ICD-10-CM | POA: Diagnosis not present

## 2022-08-31 DIAGNOSIS — M542 Cervicalgia: Secondary | ICD-10-CM | POA: Diagnosis not present

## 2022-08-31 DIAGNOSIS — M9903 Segmental and somatic dysfunction of lumbar region: Secondary | ICD-10-CM | POA: Diagnosis not present

## 2022-08-31 DIAGNOSIS — M5481 Occipital neuralgia: Secondary | ICD-10-CM | POA: Diagnosis not present

## 2022-08-31 DIAGNOSIS — M9901 Segmental and somatic dysfunction of cervical region: Secondary | ICD-10-CM | POA: Diagnosis not present

## 2022-09-01 DIAGNOSIS — M5432 Sciatica, left side: Secondary | ICD-10-CM | POA: Diagnosis not present

## 2022-09-02 DIAGNOSIS — M542 Cervicalgia: Secondary | ICD-10-CM | POA: Diagnosis not present

## 2022-09-02 DIAGNOSIS — M5481 Occipital neuralgia: Secondary | ICD-10-CM | POA: Diagnosis not present

## 2022-09-02 DIAGNOSIS — M5459 Other low back pain: Secondary | ICD-10-CM | POA: Diagnosis not present

## 2022-09-02 DIAGNOSIS — M9903 Segmental and somatic dysfunction of lumbar region: Secondary | ICD-10-CM | POA: Diagnosis not present

## 2022-09-02 DIAGNOSIS — M9901 Segmental and somatic dysfunction of cervical region: Secondary | ICD-10-CM | POA: Diagnosis not present

## 2022-09-03 DIAGNOSIS — M5459 Other low back pain: Secondary | ICD-10-CM | POA: Diagnosis not present

## 2022-09-03 DIAGNOSIS — M542 Cervicalgia: Secondary | ICD-10-CM | POA: Diagnosis not present

## 2022-09-03 DIAGNOSIS — M5481 Occipital neuralgia: Secondary | ICD-10-CM | POA: Diagnosis not present

## 2022-09-03 DIAGNOSIS — M9903 Segmental and somatic dysfunction of lumbar region: Secondary | ICD-10-CM | POA: Diagnosis not present

## 2022-09-03 DIAGNOSIS — M9901 Segmental and somatic dysfunction of cervical region: Secondary | ICD-10-CM | POA: Diagnosis not present

## 2022-09-07 DIAGNOSIS — M9903 Segmental and somatic dysfunction of lumbar region: Secondary | ICD-10-CM | POA: Diagnosis not present

## 2022-09-07 DIAGNOSIS — M542 Cervicalgia: Secondary | ICD-10-CM | POA: Diagnosis not present

## 2022-09-07 DIAGNOSIS — M9901 Segmental and somatic dysfunction of cervical region: Secondary | ICD-10-CM | POA: Diagnosis not present

## 2022-09-07 DIAGNOSIS — M5459 Other low back pain: Secondary | ICD-10-CM | POA: Diagnosis not present

## 2022-10-13 ENCOUNTER — Other Ambulatory Visit: Payer: Self-pay | Admitting: Family

## 2022-10-13 DIAGNOSIS — F320 Major depressive disorder, single episode, mild: Secondary | ICD-10-CM

## 2022-10-13 DIAGNOSIS — F5101 Primary insomnia: Secondary | ICD-10-CM

## 2022-10-22 DIAGNOSIS — M9903 Segmental and somatic dysfunction of lumbar region: Secondary | ICD-10-CM | POA: Diagnosis not present

## 2022-10-22 DIAGNOSIS — M9901 Segmental and somatic dysfunction of cervical region: Secondary | ICD-10-CM | POA: Diagnosis not present

## 2022-10-22 DIAGNOSIS — M542 Cervicalgia: Secondary | ICD-10-CM | POA: Diagnosis not present

## 2022-10-22 DIAGNOSIS — M5459 Other low back pain: Secondary | ICD-10-CM | POA: Diagnosis not present

## 2022-10-30 DIAGNOSIS — M542 Cervicalgia: Secondary | ICD-10-CM | POA: Diagnosis not present

## 2022-10-30 DIAGNOSIS — M5459 Other low back pain: Secondary | ICD-10-CM | POA: Diagnosis not present

## 2022-10-30 DIAGNOSIS — M9901 Segmental and somatic dysfunction of cervical region: Secondary | ICD-10-CM | POA: Diagnosis not present

## 2022-10-30 DIAGNOSIS — M9903 Segmental and somatic dysfunction of lumbar region: Secondary | ICD-10-CM | POA: Diagnosis not present

## 2022-11-04 DIAGNOSIS — M542 Cervicalgia: Secondary | ICD-10-CM | POA: Diagnosis not present

## 2022-11-04 DIAGNOSIS — M9901 Segmental and somatic dysfunction of cervical region: Secondary | ICD-10-CM | POA: Diagnosis not present

## 2022-11-04 DIAGNOSIS — M5459 Other low back pain: Secondary | ICD-10-CM | POA: Diagnosis not present

## 2022-11-04 DIAGNOSIS — M9903 Segmental and somatic dysfunction of lumbar region: Secondary | ICD-10-CM | POA: Diagnosis not present

## 2022-11-12 DIAGNOSIS — M542 Cervicalgia: Secondary | ICD-10-CM | POA: Diagnosis not present

## 2022-11-12 DIAGNOSIS — M9903 Segmental and somatic dysfunction of lumbar region: Secondary | ICD-10-CM | POA: Diagnosis not present

## 2022-11-12 DIAGNOSIS — M9901 Segmental and somatic dysfunction of cervical region: Secondary | ICD-10-CM | POA: Diagnosis not present

## 2022-11-12 DIAGNOSIS — M5459 Other low back pain: Secondary | ICD-10-CM | POA: Diagnosis not present

## 2022-11-26 DIAGNOSIS — M9903 Segmental and somatic dysfunction of lumbar region: Secondary | ICD-10-CM | POA: Diagnosis not present

## 2022-11-26 DIAGNOSIS — M542 Cervicalgia: Secondary | ICD-10-CM | POA: Diagnosis not present

## 2022-11-26 DIAGNOSIS — M9901 Segmental and somatic dysfunction of cervical region: Secondary | ICD-10-CM | POA: Diagnosis not present

## 2022-11-26 DIAGNOSIS — M5459 Other low back pain: Secondary | ICD-10-CM | POA: Diagnosis not present

## 2022-11-27 ENCOUNTER — Ambulatory Visit (INDEPENDENT_AMBULATORY_CARE_PROVIDER_SITE_OTHER): Payer: Medicare Other

## 2022-11-27 VITALS — Ht 67.0 in | Wt 200.0 lb

## 2022-11-27 DIAGNOSIS — Z0000 Encounter for general adult medical examination without abnormal findings: Secondary | ICD-10-CM | POA: Diagnosis not present

## 2022-11-27 NOTE — Patient Instructions (Signed)
Ms. Catherine [name] , Thank you for taking time to come for your Medicare Wellness Visit. I appreciate your ongoing commitment to your health goals. Please review the following plan we discussed and let me know if I can assist you in the future.   These are the goals we discussed:  Goals      Exercise 150 min/wk Moderate Activity        This is a list of the screening recommended for you and due dates:  Health Maintenance  Topic Date Due   COVID-19 Vaccine (1) Never done   Colon Cancer Screening  Never done   Zoster (Shingles) Vaccine (1 of 2) Never done   Flu Shot  Never done   Pneumonia Vaccine (2 - PPSV23 or PCV20) 06/30/2023*   Mammogram  02/03/2023   Medicare Annual Wellness Visit  11/28/2023   DTaP/Tdap/Td vaccine (2 - Td or Tdap) 05/07/2031   DEXA scan (bone density measurement)  Completed   Hepatitis C Screening: USPSTF Recommendation to screen - Ages 54-79 yo.  Completed   HPV Vaccine  Aged Out  *Topic was postponed. The date shown is not the original due date.    Advanced directives: Advance directive discussed with you today. I have provided a copy for you to complete at home and have notarized. Once this is complete please bring a copy in to our office so we can scan it into your chart.   Conditions/risks identified: Aim for 30 minutes of exercise or brisk walking, 6-8 glasses of water, and 5 servings of fruits and vegetables each day.   Next appointment: Follow up in one year for your annual wellness visit    Preventive Care 65 Years and Older, Female Preventive care refers to lifestyle choices and visits with your health care provider that can promote health and wellness. What does preventive care include? A yearly physical exam. This is also called an annual well check. Dental exams once or twice a year. Routine eye exams. Ask your health care provider how often you should have your eyes checked. Personal lifestyle choices, including: Daily care of your teeth and  gums. Regular physical activity. Eating a healthy diet. Avoiding tobacco and drug use. Limiting alcohol use. Practicing safe sex. Taking low-dose aspirin every day. Taking vitamin and mineral supplements as recommended by your health care provider. What happens during an annual well check? The services and screenings done by your health care provider during your annual well check will depend on your age, overall health, lifestyle risk factors, and family history of disease. Counseling  Your health care provider may ask you questions about your: Alcohol use. Tobacco use. Drug use. Emotional well-being. Home and relationship well-being. Sexual activity. Eating habits. History of falls. Memory and ability to understand (cognition). Work and work Statistician. Reproductive health. Screening  You may have the following tests or measurements: Height, weight, and BMI. Blood pressure. Lipid and cholesterol levels. These may be checked every 5 years, or more frequently if you are over 70 years old. Skin check. Lung cancer screening. You may have this screening every year starting at age 47 if you have a 30-pack-year history of smoking and currently smoke or have quit within the past 15 years. Fecal occult blood test (FOBT) of the stool. You may have this test every year starting at age 67. Flexible sigmoidoscopy or colonoscopy. You may have a sigmoidoscopy every 5 years or a colonoscopy every 10 years starting at age 41. Hepatitis C blood test. Hepatitis B blood  test. Sexually transmitted disease (STD) testing. Diabetes screening. This is done by checking your blood sugar (glucose) after you have not eaten for a while (fasting). You may have this done every 1-3 years. Bone density scan. This is done to screen for osteoporosis. You may have this done starting at age 73. Mammogram. This may be done every 1-2 years. Talk to your health care provider about how often you should have regular  mammograms. Talk with your health care provider about your test results, treatment options, and if necessary, the need for more tests. Vaccines  Your health care provider may recommend certain vaccines, such as: Influenza vaccine. This is recommended every year. Tetanus, diphtheria, and acellular pertussis (Tdap, Td) vaccine. You may need a Td booster every 10 years. Zoster vaccine. You may need this after age 59. Pneumococcal 13-valent conjugate (PCV13) vaccine. One dose is recommended after age 87. Pneumococcal polysaccharide (PPSV23) vaccine. One dose is recommended after age 48. Talk to your health care provider about which screenings and vaccines you need and how often you need them. This information is not intended to replace advice given to you by your health care provider. Make sure you discuss any questions you have with your health care provider. Document Released: 12/27/2015 Document Revised: 08/19/2016 Document Reviewed: 10/01/2015 Elsevier Interactive Patient Education  2017 [place] Prevention in the Home Falls can cause injuries. They can happen to people of all ages. There are many things you can do to make your home safe and to help prevent falls. What can I do on the outside of my home? Regularly fix the edges of walkways and driveways and fix any cracks. Remove anything that might make you trip as you walk through a door, such as a raised step or threshold. Trim any bushes or trees on the path to your home. Use bright outdoor lighting. Clear any walking paths of anything that might make someone trip, such as rocks or tools. Regularly check to see if handrails are loose or broken. Make sure that both sides of any steps have handrails. Any raised decks and porches should have guardrails on the edges. Have any leaves, snow, or ice cleared regularly. Use sand or salt on walking paths during winter. Clean up any spills in your garage right away. This includes oil  or grease spills. What can I do in the bathroom? Use night lights. Install grab bars by the toilet and in the tub and shower. Do not use towel bars as grab bars. Use non-skid mats or decals in the tub or shower. If you need to sit down in the shower, use a plastic, non-slip stool. Keep the floor dry. Clean up any water that spills on the floor as soon as it happens. Remove soap buildup in the tub or shower regularly. Attach bath mats securely with double-sided non-slip rug tape. Do not have throw rugs and other things on the floor that can make you trip. What can I do in the bedroom? Use night lights. Make sure that you have a light by your bed that is easy to reach. Do not use any sheets or blankets that are too big for your bed. They should not hang down onto the floor. Have a firm chair that has side arms. You can use this for support while you get dressed. Do not have throw rugs and other things on the floor that can make you trip. What can I do in the kitchen? Clean up any spills right  away. Avoid walking on wet floors. Keep items that you use a lot in easy-to-reach places. If you need to reach something above you, use a strong step stool that has a grab bar. Keep electrical cords out of the way. Do not use floor polish or wax that makes floors slippery. If you must use wax, use non-skid floor wax. Do not have throw rugs and other things on the floor that can make you trip. What can I do with my stairs? Do not leave any items on the stairs. Make sure that there are handrails on both sides of the stairs and use them. Fix handrails that are broken or loose. Make sure that handrails are as long as the stairways. Check any carpeting to make sure that it is firmly attached to the stairs. Fix any carpet that is loose or worn. Avoid having throw rugs at the top or bottom of the stairs. If you do have throw rugs, attach them to the floor with carpet tape. Make sure that you have a light  switch at the top of the stairs and the bottom of the stairs. If you do not have them, ask someone to add them for you. What else can I do to help prevent falls? Wear shoes that: Do not have high heels. Have rubber bottoms. Are comfortable and fit you well. Are closed at the toe. Do not wear sandals. If you use a stepladder: Make sure that it is fully opened. Do not climb a closed stepladder. Make sure that both sides of the stepladder are locked into place. Ask someone to hold it for you, if possible. Clearly mark and make sure that you can see: Any grab bars or handrails. First and last steps. Where the edge of each step is. Use tools that help you move around (mobility aids) if they are needed. These include: Canes. Walkers. Scooters. Crutches. Turn on the lights when you go into a dark area. Replace any light bulbs as soon as they burn out. Set up your furniture so you have a clear path. Avoid moving your furniture around. If any of your floors are uneven, fix them. If there are any pets around you, be aware of where they are. Review your medicines with your doctor. Some medicines can make you feel dizzy. This can increase your chance of falling. Ask your doctor what other things that you can do to help prevent falls. This information is not intended to replace advice given to you by your health care provider. Make sure you discuss any questions you have with your health care provider. Document Released: 09/26/2009 Document Revised: 05/07/2016 Document Reviewed: 01/04/2015 Elsevier Interactive Patient Education  2017 [employer].

## 2022-11-27 NOTE — Progress Notes (Signed)
Subjective:   Catherine [name] is a 66 y.o. female who presents for Medicare Annual (Subsequent) preventive examination. I connected with  Huma Cones on 11/27/22 by a audio enabled telemedicine application and verified that I am speaking with the correct person using two identifiers.  Patient Location: Home  Provider Location: Home Office  I discussed the limitations of evaluation and management by telemedicine. The patient expressed understanding and agreed to proceed.  Review of Systems     Cardiac Risk Factors include: advanced age (>25mn, >>38women)     Objective:    Today's Vitals   11/27/22 1034  Weight: 200 lb (90.7 kg)  Height: '5\' 7"'$  (1.702 m)   Body mass index is 31.32 kg/m.     11/27/2022   10:38 AM 11/26/2021   10:19 AM  Advanced Directives  Does Patient Have a Medical Advance Directive? No No  Would patient like information on creating a medical advance directive? No - Patient declined No - Patient declined    Current Medications (verified) Outpatient Encounter Medications as of 11/27/2022  Medication Sig   busPIRone (BUSPAR) 5 MG tablet Take 1 tablet (5 mg total) by mouth 3 (three) times daily as needed.   cyclobenzaprine (FLEXERIL) 10 MG tablet Take 1 tablet (10 mg total) by mouth 3 (three) times daily as needed for muscle spasms.   DULoxetine (CYMBALTA) 60 MG capsule Take 1 capsule (60 mg total) by mouth daily.   rosuvastatin (CRESTOR) 10 MG tablet Take 1 tablet (10 mg total) by mouth daily.   traZODone (DESYREL) 50 MG tablet TAKE 1 TABLET BY MOUTH AT BEDTIME AS NEEDED FOR SLEEP   No facility-administered encounter medications on file as of 11/27/2022.    Allergies (verified) Penicillins   History: Past Medical History:  Diagnosis Date   Depression    Past Surgical History:  Procedure Laterality Date   Endometrial ablation 11994-03-20    SPINE SURGERY     TUBAL LIGATION  12/14/1986   Family History  Problem Relation Age of Onset   Diabetes  Mother    Hypertension Mother    Cancer Father        lung   Hypertension Father    Social History   Socioeconomic History   Marital status: Widowed    Spouse name: Not on file   Number of children: 2   Years of education: Not on file   Highest education level: Not on file  Occupational History   Occupation: sTechnical sales engineer Tobacco Use   Smoking status: Never   Smokeless tobacco: Never  Vaping Use   Vaping Use: Never used  Substance and Sexual Activity   Alcohol use: Not on file   Drug use: Never   Sexual activity: Not on file  Other Topics Concern   Not on file  Social History Narrative   [place]   Husband passed away in late 203/20/22  Social Determinants of Health   Financial Resource Strain: [place] (11/27/2022)   Overall Financial Resource Strain (CARDIA)    Difficulty of Paying Living Expenses: Not hard at all  Food Insecurity: No Food Insecurity (11/27/2022)   Hunger Vital Sign    Worried About Running Out of Food in the Last Year: Never true    Ran Out of Food in the Last Year: Never true  Transportation Needs: No Transportation Needs (11/27/2022)   [place]- Transportation  Lack of Transportation (Medical): No    Lack of Transportation (Non-Medical): No  Physical Activity: Insufficiently Active (11/27/2022)   Exercise Vital Sign    Days of Exercise per Week: 3 days    Minutes of Exercise per Session: 30 min  Stress: No Stress Concern Present (11/27/2022)   [place]    Feeling of Stress : Not at all  Social Connections: Moderately Isolated (11/27/2022)   Social Connection and Isolation Panel [NHANES]    Frequency of Communication with Friends and Family: More than three times a week    Frequency of Social Gatherings with Friends and Family: More than three times a week    Attends Religious Services: Never    Marine scientist  or Organizations: Yes    Attends Music therapist: More than 4 times per year    Marital Status: Widowed    Tobacco Counseling Counseling given: Not Answered   Clinical Intake:  Pre-visit preparation completed: Yes  Pain : No/denies pain     Nutritional Risks: None Diabetes: No  How often do you need to have someone help you when you read instructions, pamphlets, or other written materials from your doctor or pharmacy?: 1 - Never  Diabetic?no   Interpreter Needed?: No  Information entered by :: Jadene Pierini, LPN   Activities of Daily Living    11/27/2022   10:37 AM  In your present state of health, do you have any difficulty performing the following activities:  Hearing? 0  Vision? 0  Difficulty concentrating or making decisions? 0  Walking or climbing stairs? 0  Dressing or bathing? 0  Doing errands, shopping? 0  Preparing Food and eating ? N  Using the Toilet? N  In the past six months, have you accidently leaked urine? N  Do you have problems with loss of bowel control? N  Managing your Medications? N  Managing your Finances? N  Housekeeping or managing your Housekeeping? N    Patient Care Team: Sharion Balloon, FNP as PCP - General (Family Medicine)  Indicate any recent Medical Services you may have received from other than [facility] providers in the past year (date may be approximate).     Assessment:   This is a routine wellness examination for Catherine [name].  Hearing/Vision screen Vision Screening - Comments:: Wears rx glasses - up to date with routine eye exams with  Dr.Stanton   Dietary issues and exercise activities discussed: Current Exercise Habits: Home exercise routine, Type of exercise: walking, Time (Minutes): 30, Frequency (Times/Week): 3, Weekly Exercise (Minutes/Week): 90, Intensity: Mild, Exercise limited by: None identified   Goals Addressed             This Visit's Progress    Exercise 150 min/wk Moderate Activity   On  track      Depression Screen    11/27/2022   10:36 AM 07/28/2022   10:32 AM 06/29/2022    4:14 PM 11/26/2021   10:17 AM 09/18/2021    8:25 AM 06/20/2021   10:09 AM 10/30/2020    2:04 PM  PHQ 2/9 Scores  PHQ - 2 Score 0 '2 3 2 '$ 0 0 2  PHQ- 9 Score  '3 5 3 1 '$ 0 6    Fall Risk    11/27/2022   10:35 AM 07/28/2022   10:31 AM 06/29/2022    3:33 PM 11/26/2021   10:19 AM 06/20/2021   10:09 AM  [place]  in the past year? 0 0 0 0 0  Number falls in past yr: 0   0   Injury with Fall? 0   0   Risk for fall due to : No Fall Risks   Medication side effect   Follow up Falls prevention discussed   Falls prevention discussed     FALL RISK PREVENTION PERTAINING TO THE HOME:  Any stairs in or around the home? No  If so, are there any without handrails? No  Home free of loose throw rugs in walkways, pet beds, electrical cords, etc? Yes  Adequate lighting in your home to reduce risk of falls? Yes   ASSISTIVE DEVICES UTILIZED TO PREVENT FALLS:  Life alert? No  Use of a cane, walker or w/c? No  Grab bars in the bathroom? No  Shower chair or bench in shower? No  Elevated toilet seat or a handicapped toilet? No          11/27/2022   10:38 AM 11/26/2021   10:20 AM  6CIT Screen  What Year? 0 points 0 points  What month? 0 points 0 points  What time? 0 points 0 points  Count back from 20 0 points 0 points  Months in reverse 0 points 0 points  Repeat phrase 0 points 2 points  Total Score 0 points 2 points    Immunizations Immunization History  Administered Date(s) Administered   Pneumococcal Conjugate-13 06/20/2021   Tdap 05/06/2021    TDAP status: Up to date  Flu Vaccine status: Due, Education has been provided regarding the importance of this vaccine. Advised may receive this vaccine at local pharmacy or Health Dept. Aware to provide a copy of the vaccination record if obtained from local pharmacy or Health Dept. Verbalized acceptance and understanding.  Pneumococcal  vaccine status: Up to date  Covid-19 vaccine status: Completed vaccines  Qualifies for Shingles Vaccine? Yes   Zostavax completed No   Shingrix Completed?: No.    Education has been provided regarding the importance of this vaccine. Patient has been advised to call insurance company to determine out of pocket expense if they have not yet received this vaccine. Advised may also receive vaccine at local pharmacy or Health Dept. Verbalized acceptance and understanding.  Screening Tests Health Maintenance  Topic Date Due   COVID-19 Vaccine (1) Never done   COLONOSCOPY (Pts 45-2yr Insurance coverage will need to be confirmed)  Never done   Zoster Vaccines- Shingrix (1 of 2) Never done   INFLUENZA VACCINE  Never done   Pneumonia Vaccine 68 Years old (2 - PPSV23 or PCV20) 06/30/2023 (Originally 06/20/2022)   MAMMOGRAM  02/03/2023   Medicare Annual Wellness (AWV)  11/28/2023   DTaP/Tdap/Td (2 - Td or Tdap) 05/07/2031   DEXA SCAN  Completed   Hepatitis C Screening  Completed   HPV VACCINES  Aged Out    Health Maintenance  Health Maintenance Due  Topic Date Due   COVID-19 Vaccine (1) Never done   COLONOSCOPY (Pts 45-469yrInsurance coverage will need to be confirmed)  Never done   Zoster Vaccines- Shingrix (1 of 2) Never done   INFLUENZA VACCINE  Never done    Colorectal cancer screening: Referral to GI placed declined . Pt aware the office will call re: appt.  Mammogram status: Completed 02/03/2022. Repeat every year  Bone Density status: Completed 06/20/2021. Results reflect: Bone density results: OSTEOPENIA. Repeat every 5 years.  Lung Cancer Screening: (Low Dose CT Chest recommended if Age 68-80ears, 3019  pack-year currently smoking OR have quit w/in 15years.) does not qualify.   Lung Cancer Screening Referral: n/a  Additional Screening:  Hepatitis C Screening: does not qualify; 06/20/2021  Vision Screening: Recommended annual ophthalmology exams for early detection of  glaucoma and other disorders of the eye. Is the patient up to date with their annual eye exam?  Yes  Who is the provider or what is the name of the office in which the patient attends annual eye exams? Dr.stanton  If pt is not established with a provider, would they like to be referred to a provider to establish care? No .   Dental Screening: Recommended annual dental exams for proper oral hygiene  Community Resource Referral / Chronic Care Management: CRR required this visit?  No   CCM required this visit?  No      Plan:     I have personally reviewed and noted the following in the patient's chart:   Medical and social history Use of alcohol, tobacco or illicit drugs  Current medications and supplements including opioid prescriptions. Patient is not currently taking opioid prescriptions. Functional ability and status Nutritional status Physical activity Advanced directives List of other physicians Hospitalizations, surgeries, and ER visits in previous 12 months Vitals Screenings to include cognitive, depression, and falls Referrals and appointments  In addition, I have reviewed and discussed with patient certain preventive protocols, quality metrics, and best practice recommendations. A written personalized care plan for preventive services as well as general preventive health recommendations were provided to patient.     Daphane Shepherd, LPN   [id number]   Nurse Notes: Due Flu vaccine /Declined Colonoscopy

## 2022-12-03 DIAGNOSIS — M5459 Other low back pain: Secondary | ICD-10-CM | POA: Diagnosis not present

## 2022-12-03 DIAGNOSIS — M9903 Segmental and somatic dysfunction of lumbar region: Secondary | ICD-10-CM | POA: Diagnosis not present

## 2022-12-03 DIAGNOSIS — M9901 Segmental and somatic dysfunction of cervical region: Secondary | ICD-10-CM | POA: Diagnosis not present

## 2022-12-03 DIAGNOSIS — M542 Cervicalgia: Secondary | ICD-10-CM | POA: Diagnosis not present

## 2022-12-09 ENCOUNTER — Other Ambulatory Visit: Payer: Self-pay | Admitting: Family

## 2022-12-09 DIAGNOSIS — F411 Generalized anxiety disorder: Secondary | ICD-10-CM

## 2022-12-09 DIAGNOSIS — F320 Major depressive disorder, single episode, mild: Secondary | ICD-10-CM

## 2023-01-15 ENCOUNTER — Telehealth: Payer: Self-pay | Admitting: Family

## 2023-01-15 DIAGNOSIS — F5101 Primary insomnia: Secondary | ICD-10-CM

## 2023-01-15 DIAGNOSIS — M545 Low back pain, unspecified: Secondary | ICD-10-CM

## 2023-01-15 DIAGNOSIS — F320 Major depressive disorder, single episode, mild: Secondary | ICD-10-CM

## 2023-01-15 DIAGNOSIS — F411 Generalized anxiety disorder: Secondary | ICD-10-CM

## 2023-01-15 NOTE — Telephone Encounter (Signed)
Pt scheduled for 02/04/2023. She is asking for a refills till. Pt says that after this apt she will be moving to [facility].

## 2023-01-15 NOTE — Telephone Encounter (Signed)
[facility] NTBS 30 days give 12/09/22

## 2023-01-19 DIAGNOSIS — F32A Depression, unspecified: Secondary | ICD-10-CM | POA: Diagnosis not present

## 2023-01-19 DIAGNOSIS — Z79899 Other long term (current) drug therapy: Secondary | ICD-10-CM | POA: Diagnosis not present

## 2023-01-19 MED ORDER — DULOXETINE HCL 60 MG PO CPEP: 60.0000 mg | ORAL_CAPSULE | Freq: Every day | ORAL | 0 refills | Status: DC

## 2023-01-19 MED ORDER — BUSPIRONE HCL 5 MG PO TABS: 5.0000 mg | ORAL_TABLET | Freq: Three times a day (TID) | ORAL | 0 refills | Status: DC | PRN

## 2023-01-19 MED ORDER — CYCLOBENZAPRINE HCL 10 MG PO TABS: 10.0000 mg | ORAL_TABLET | Freq: Three times a day (TID) | ORAL | 1 refills | Status: DC | PRN

## 2023-01-19 MED ORDER — TRAZODONE HCL 50 MG PO TABS: 50.0000 mg | ORAL_TABLET | Freq: Every evening | ORAL | 1 refills | Status: DC | PRN

## 2023-01-19 MED ORDER — ROSUVASTATIN CALCIUM 10 MG PO TABS: 10.0000 mg | ORAL_TABLET | Freq: Every day | ORAL | 0 refills | Status: DC

## 2023-01-19 NOTE — Telephone Encounter (Signed)
Prescriptions sent to pharmacy, make sure to keep chronic follow up appointment with me.

## 2023-01-19 NOTE — Addendum Note (Signed)
Addended by: Evelina Dun A on: 01/19/2023 12:57 PM   Modules accepted: Orders

## 2023-01-19 NOTE — Telephone Encounter (Signed)
Wants to talk to [facility] about getting her refills. Told patient they were refused and needed to be seen and she has appt 02-04-23.

## 2023-01-19 NOTE — Telephone Encounter (Signed)
Na

## 2023-01-19 NOTE — Telephone Encounter (Signed)
Vm left.

## 2023-01-20 NOTE — Telephone Encounter (Signed)
Patient aware and verbalized understanding. °

## 2023-01-27 ENCOUNTER — Other Ambulatory Visit: Payer: Self-pay | Admitting: Family

## 2023-01-27 DIAGNOSIS — Z1231 Encounter for screening mammogram for malignant neoplasm of breast: Secondary | ICD-10-CM

## 2023-02-04 ENCOUNTER — Ambulatory Visit (INDEPENDENT_AMBULATORY_CARE_PROVIDER_SITE_OTHER): Payer: Medicare Other | Admitting: Family

## 2023-02-04 ENCOUNTER — Encounter: Payer: Self-pay | Admitting: Family

## 2023-02-04 VITALS — BP 124/77 | HR 98 | Temp 97.3°F | Ht 67.0 in | Wt 194.2 lb

## 2023-02-04 DIAGNOSIS — E785 Hyperlipidemia, unspecified: Secondary | ICD-10-CM

## 2023-02-04 DIAGNOSIS — L918 Other hypertrophic disorders of the skin: Secondary | ICD-10-CM

## 2023-02-04 DIAGNOSIS — F5101 Primary insomnia: Secondary | ICD-10-CM

## 2023-02-04 DIAGNOSIS — G8929 Other chronic pain: Secondary | ICD-10-CM | POA: Diagnosis not present

## 2023-02-04 DIAGNOSIS — F320 Major depressive disorder, single episode, mild: Secondary | ICD-10-CM | POA: Diagnosis not present

## 2023-02-04 DIAGNOSIS — E663 Overweight: Secondary | ICD-10-CM

## 2023-02-04 DIAGNOSIS — F411 Generalized anxiety disorder: Secondary | ICD-10-CM | POA: Diagnosis not present

## 2023-02-04 DIAGNOSIS — M545 Low back pain, unspecified: Secondary | ICD-10-CM

## 2023-02-04 MED ORDER — TRAZODONE HCL 50 MG PO TABS: 50.0000 mg | ORAL_TABLET | Freq: Every evening | ORAL | 3 refills | Status: DC | PRN

## 2023-02-04 MED ORDER — BUSPIRONE HCL 5 MG PO TABS: 5.0000 mg | ORAL_TABLET | Freq: Three times a day (TID) | ORAL | 3 refills | Status: DC | PRN

## 2023-02-04 MED ORDER — ROSUVASTATIN CALCIUM 10 MG PO TABS: 10.0000 mg | ORAL_TABLET | Freq: Every day | ORAL | 3 refills | Status: DC

## 2023-02-04 MED ORDER — CYCLOBENZAPRINE HCL 10 MG PO TABS: 10.0000 mg | ORAL_TABLET | Freq: Three times a day (TID) | ORAL | 3 refills | Status: DC | PRN

## 2023-02-04 MED ORDER — DULOXETINE HCL 60 MG PO CPEP: 60.0000 mg | ORAL_CAPSULE | Freq: Every day | ORAL | 3 refills | Status: DC

## 2023-02-04 NOTE — Progress Notes (Signed)
Subjective:    Patient ID: Catherine [name], female    DOB: 27-Mar-1956, 67 y.o.   MRN: [id number]  Chief Complaint  Patient presents with   Medical Management of Chronic Issues   PT presents to the office today chronic follow up. She reports she has two skin tags on her left neck that get hung on her clothes.  Insomnia Primary symptoms: difficulty falling asleep, frequent awakening.   The current episode started more than one year. The onset quality is gradual. The problem occurs intermittently. Past treatments include medication. The treatment provided moderate relief. PMH includes: depression.   Anxiety Presents for follow-up visit. Symptoms include insomnia. Patient reports no excessive worry, irritability, malaise, nervous/anxious behavior or restlessness. Symptoms occur rarely. The severity of symptoms is mild.    Depression        This is a chronic problem.  The current episode started more than 1 year ago.   The problem occurs intermittently.  Associated symptoms include insomnia.  Associated symptoms include no helplessness, no hopelessness, no restlessness and not sad.  Past treatments include SNRIs - Serotonin and norepinephrine reuptake inhibitors.  Past medical history includes anxiety.   Back Pain This is a chronic problem. The current episode started more than 1 year ago. The problem occurs intermittently. The problem has been waxing and waning since onset. The pain is present in the lumbar spine. The quality of the pain is described as aching. The pain is at a severity of 3/10. The pain is mild. Risk factors include obesity. She has tried home exercises for the symptoms. The treatment provided mild relief.      Review of Systems  Constitutional:  Negative for irritability.  Musculoskeletal:  Positive for back pain.  Psychiatric/Behavioral:  Positive for depression. The patient has insomnia. The patient is not nervous/anxious.   All other systems reviewed and are  negative.      Objective:   Physical Exam Vitals reviewed.  Constitutional:      General: She is not in acute distress.    Appearance: She is well-developed. She is obese.  HENT:     Head: Normocephalic and atraumatic.     Right Ear: Tympanic membrane normal.     Left Ear: Tympanic membrane normal.  Eyes:     Pupils: Pupils are equal, round, and reactive to light.  Neck:     Thyroid: No thyromegaly.  Cardiovascular:     Rate and Rhythm: Normal rate and regular rhythm.     Heart sounds: Normal heart sounds. No murmur heard. Pulmonary:     Effort: Pulmonary effort is normal. No respiratory distress.     Breath sounds: Normal breath sounds. No wheezing.  Abdominal:     General: Bowel sounds are normal. There is no distension.     Palpations: Abdomen is soft.     Tenderness: There is no abdominal tenderness.  Musculoskeletal:        General: No tenderness. Normal range of motion.     Cervical back: Normal range of motion and neck supple.  Skin:    General: Skin is warm and dry.          Comments: Skin tag on left neck  Neurological:     Mental Status: She is alert and oriented to person, place, and time.     Cranial Nerves: No cranial nerve deficit.     Deep Tendon Reflexes: Reflexes are normal and symmetric.  Psychiatric:        Behavior: Behavior  normal.        Thought Content: Thought content normal.        Judgment: Judgment normal.     Area cleaned and Two skin tags removed on left neck. Pt tolerated well.  BP 124/77   Pulse 98   Temp (!) 97.3 F (36.3 C) (Temporal)   Ht 5' 7"$  (1.702 m)   Wt 194 lb 3.2 oz (88.1 kg)   BMI 30.42 kg/m      Assessment & Plan:  Catherine [name] comes in today with chief complaint of Medical Management of Chronic Issues   Diagnosis and orders addressed:  1. GAD (generalized anxiety disorder) - busPIRone (BUSPAR) 5 MG tablet; Take 1 tablet (5 mg total) by mouth 3 (three) times daily as needed.  Dispense: 90 tablet; Refill: 3 -  DULoxetine (CYMBALTA) 60 MG capsule; Take 1 capsule (60 mg total) by mouth daily. (NEEDS TO BE SEEN BEFORE NEXT REFILL)  Dispense: 90 capsule; Refill: 3 - CMP14+EGFR  2. Depression, major, single episode, mild (HCC) - busPIRone (BUSPAR) 5 MG tablet; Take 1 tablet (5 mg total) by mouth 3 (three) times daily as needed.  Dispense: 90 tablet; Refill: 3 - DULoxetine (CYMBALTA) 60 MG capsule; Take 1 capsule (60 mg total) by mouth daily. (NEEDS TO BE SEEN BEFORE NEXT REFILL)  Dispense: 90 capsule; Refill: 3 - traZODone (DESYREL) 50 MG tablet; Take 1 tablet (50 mg total) by mouth at bedtime as needed. for sleep  Dispense: 90 tablet; Refill: 3 - CMP14+EGFR  3. Bilateral low back pain without sciatica, unspecified chronicity - cyclobenzaprine (FLEXERIL) 10 MG tablet; Take 1 tablet (10 mg total) by mouth 3 (three) times daily as needed for muscle spasms.  Dispense: 90 tablet; Refill: 3 - CMP14+EGFR  4. Primary insomnia - traZODone (DESYREL) 50 MG tablet; Take 1 tablet (50 mg total) by mouth at bedtime as needed. for sleep  Dispense: 90 tablet; Refill: 3 - CMP14+EGFR  5. Overweight (BMI 25.0-29.9) - CMP14+EGFR  6. Chronic bilateral low back pain without sciatica - CMP14+EGFR  7. Hyperlipidemia, unspecified hyperlipidemia type  - rosuvastatin (CRESTOR) 10 MG tablet; Take 1 tablet (10 mg total) by mouth daily.  Dispense: 90 tablet; Refill: 3  8. Skin tag Keep clean and dry   Labs pending Continue current medications  Health Maintenance reviewed Diet and exercise encouraged  Follow up plan: 6 months    Evelina Dun, FNP

## 2023-02-04 NOTE — Patient Instructions (Signed)
Skin Tag, Adult A skin tag (acrochordon) is a soft, extra growth of skin. Most skin tags are skin-colored and rarely bigger than a pencil eraser. They often form in areas where there is frequent rubbing, or friction, on the skin. This may be where there are folds in the skin, such as: The eyelids. The neck. The armpits. The groin. Skin tags are not dangerous, and they do not spread from person to person (are not contagious). You may have one skin tag or many. Skin tags do not need treatment. However, your health care provider may recommend removing a skin tag if it: Gets irritated from clothing or jewelry. Bleeds. Is visible and unsightly. What are the causes? This condition is linked to: Increasing age. Pregnancy. Diabetes. Obesity. What are the signs or symptoms? Skin tags usually do not cause symptoms unless they get irritated by items touching your skin, such as clothing or jewelry. When this happens, you may have pain, itching, or bleeding. How is this diagnosed? This condition is diagnosed with an evaluation from your health care provider. No testing is needed for diagnosis. How is this treated? Treatment for this condition depends on whether you have symptoms. Your health care provider may also remove your skin tag if it is visible or if you do not like the way it looks. A skin tag can be removed by a health care provider with: A simple surgical procedure using scissors. A procedure that involves freezing your skin tag with a gas in liquid form (liquid nitrogen). A procedure that uses heat to destroy your skin tag (electrodessication). Follow these instructions at home: Watch for any changes in your skin tag. A normal skin tag does not require any other special care at home. Take over-the-counter and prescription medicines only as told by your health care provider. Keep all follow-up visits. Contact a health care provider if: You have a skin tag that: Becomes painful. Changes  color. Bleeds. Swells. Summary Skin tags are soft, extra growths of skin found in areas of frequent rubbing or friction. Skin tags usually do not cause symptoms. If symptoms occur, you may have pain, itching, or bleeding. Your health care provider may remove your skin tag if it causes symptoms or if you do not like the way it looks. This information is not intended to replace advice given to you by your health care provider. Make sure you discuss any questions you have with your health care provider. Document Revised: 01/14/2022 Document Reviewed: 01/14/2022 Elsevier Patient Education  Catherine [name].

## 2023-02-05 LAB — CMP14+EGFR
ALT: 33 IU/L — ABNORMAL HIGH (ref 0–32)
AST: 34 IU/L (ref 0–40)
Albumin/Globulin Ratio: 1.7 (ref 1.2–2.2)
Albumin: 4.5 g/dL (ref 3.9–4.9)
Alkaline Phosphatase: 110 IU/L (ref 44–121)
BUN/Creatinine Ratio: 9 — ABNORMAL LOW (ref 12–28)
BUN: 9 mg/dL (ref 8–27)
Bilirubin Total: 0.9 mg/dL (ref 0.0–1.2)
CO2: 23 mmol/L (ref 20–29)
Calcium: 9.8 mg/dL (ref 8.7–10.3)
Chloride: 102 mmol/L (ref 96–106)
Creatinine, Ser: 1.02 mg/dL — ABNORMAL HIGH (ref 0.57–1.00)
Globulin, Total: 2.7 g/dL (ref 1.5–4.5)
Glucose: 131 mg/dL — ABNORMAL HIGH (ref 70–99)
Potassium: 4.4 mmol/L (ref 3.5–5.2)
Sodium: 139 mmol/L (ref 134–144)
Total Protein: 7.2 g/dL (ref 6.0–8.5)
eGFR: 60 mL/min/{1.73_m2} (ref >59)

## 2023-04-06 DIAGNOSIS — Z1231 Encounter for screening mammogram for malignant neoplasm of breast: Secondary | ICD-10-CM | POA: Diagnosis not present

## 2023-04-11 ENCOUNTER — Other Ambulatory Visit: Payer: Self-pay | Admitting: Family

## 2023-04-11 DIAGNOSIS — F5101 Primary insomnia: Secondary | ICD-10-CM

## 2023-04-11 DIAGNOSIS — F320 Major depressive disorder, single episode, mild: Secondary | ICD-10-CM

## 2023-08-05 ENCOUNTER — Ambulatory Visit: Payer: Medicare Other | Admitting: Family

## 2023-08-06 ENCOUNTER — Encounter: Payer: Self-pay | Admitting: Family

## 2023-10-13 ENCOUNTER — Encounter: Payer: Self-pay | Admitting: Family

## 2023-10-20 ENCOUNTER — Encounter: Payer: Self-pay | Admitting: Family

## 2023-11-15 IMAGING — MG MM DIGITAL SCREENING BILAT W/ TOMO AND CAD
8 series · 8 of 24 positions shown · non-contrast
Comparison: Previous exam(s).

CLINICAL DATA: Screening.

EXAM:
DIGITAL SCREENING BILATERAL MAMMOGRAM WITH TOMOSYNTHESIS AND CAD
TECHNIQUE: Bilateral screening digital craniocaudal and mediolateral oblique
mammograms were obtained. Bilateral screening digital breast
tomosynthesis was performed. The images were evaluated with
computer-aided detection.

[L MLO synth-2D]
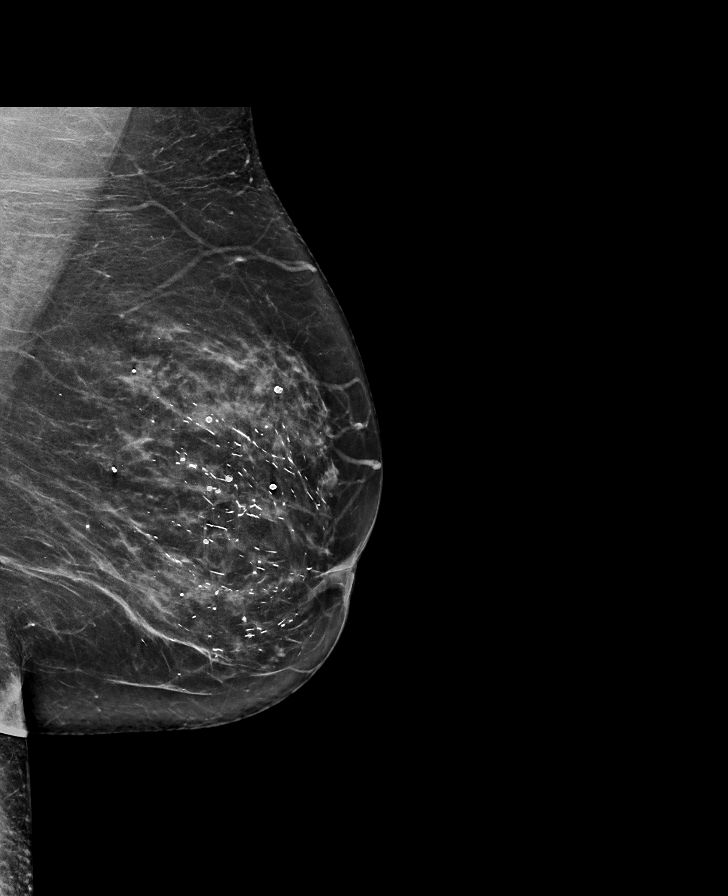

[R CC synth-2D]
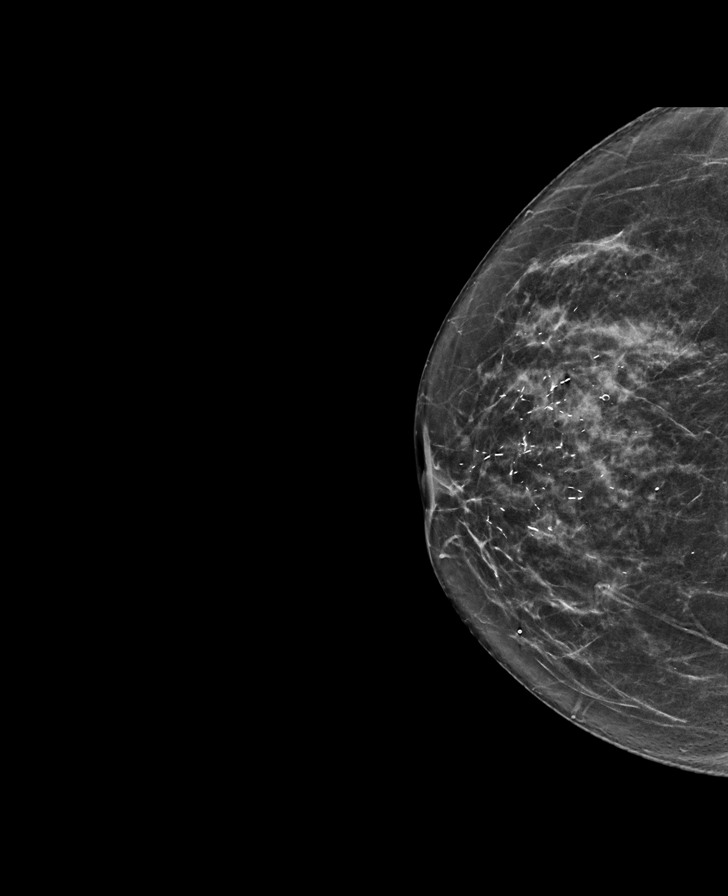

[L CC synth-2D]
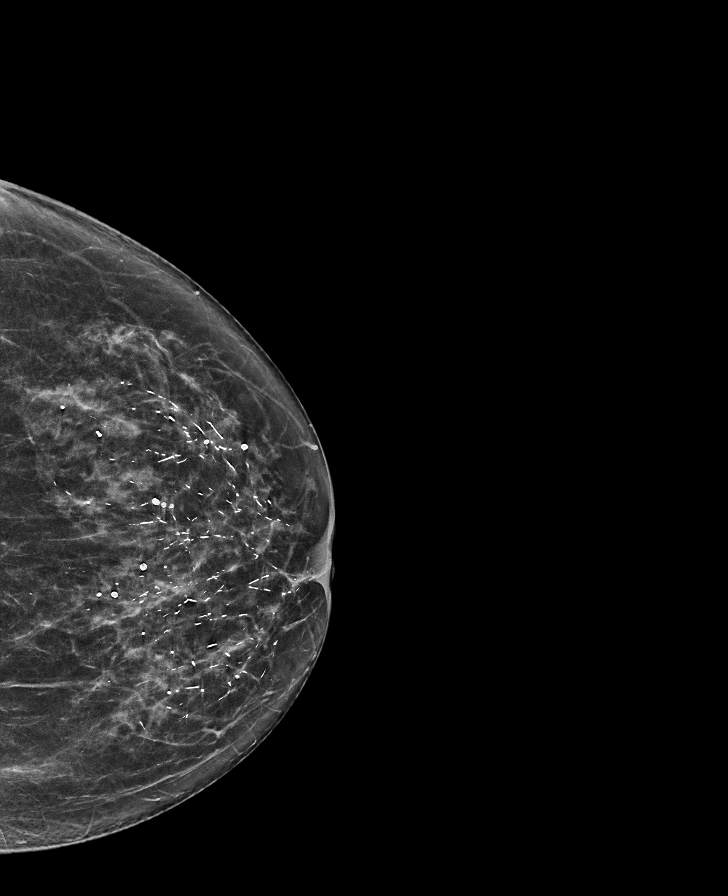

[R MLO synth-2D]
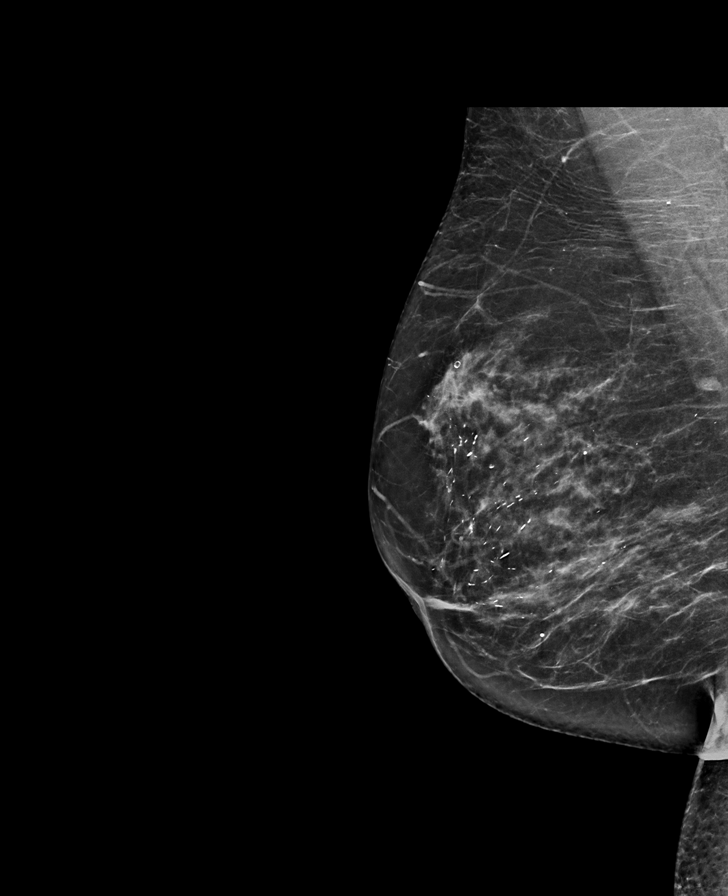

[L MLO tomo · tomo slice 40/79.0]
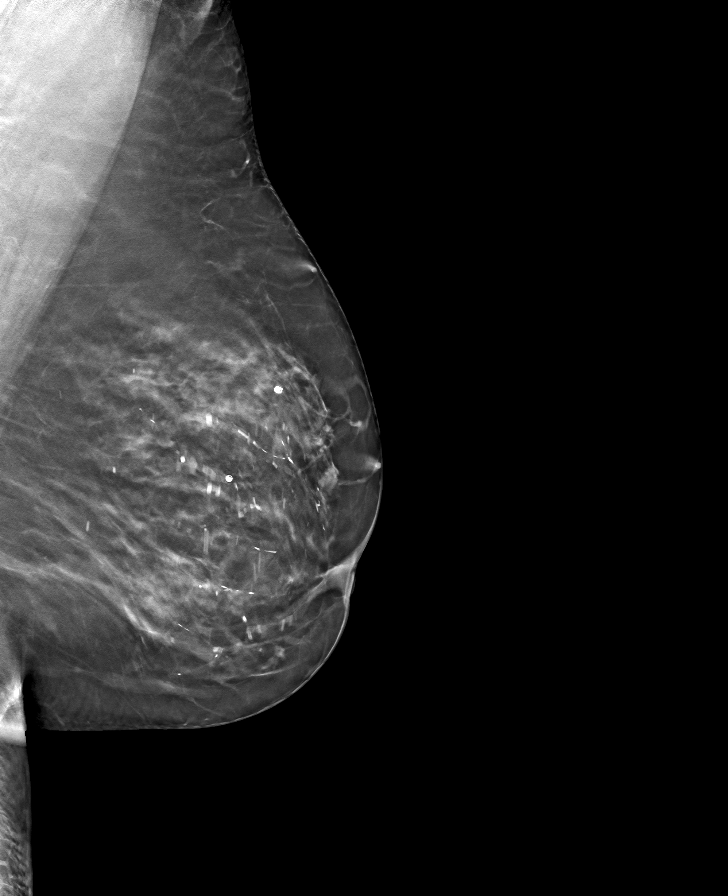

[L CC tomo · tomo slice 35/70.0]
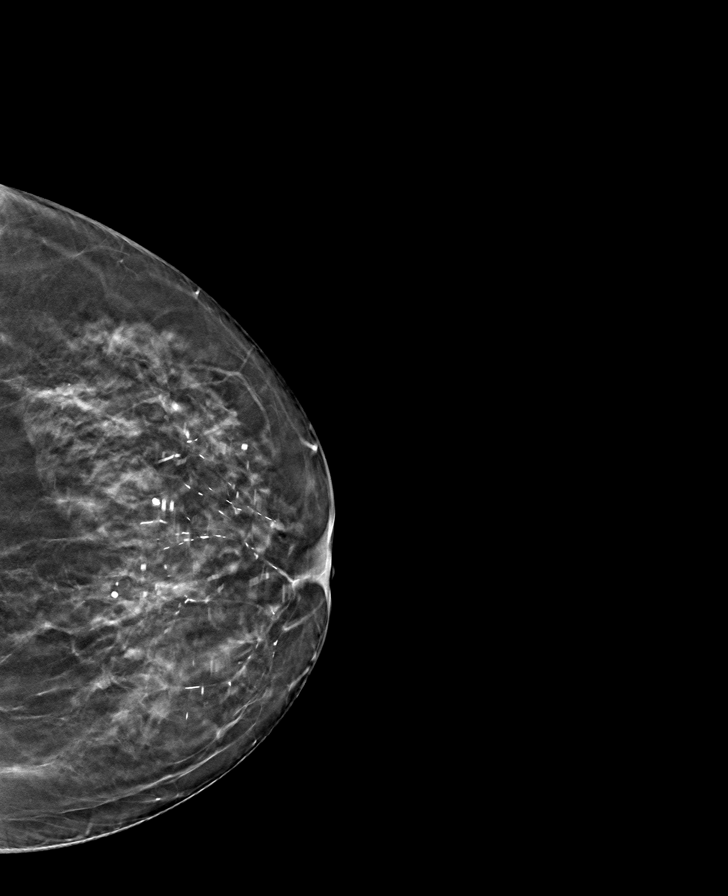

[R MLO tomo · tomo slice 39/76.0]
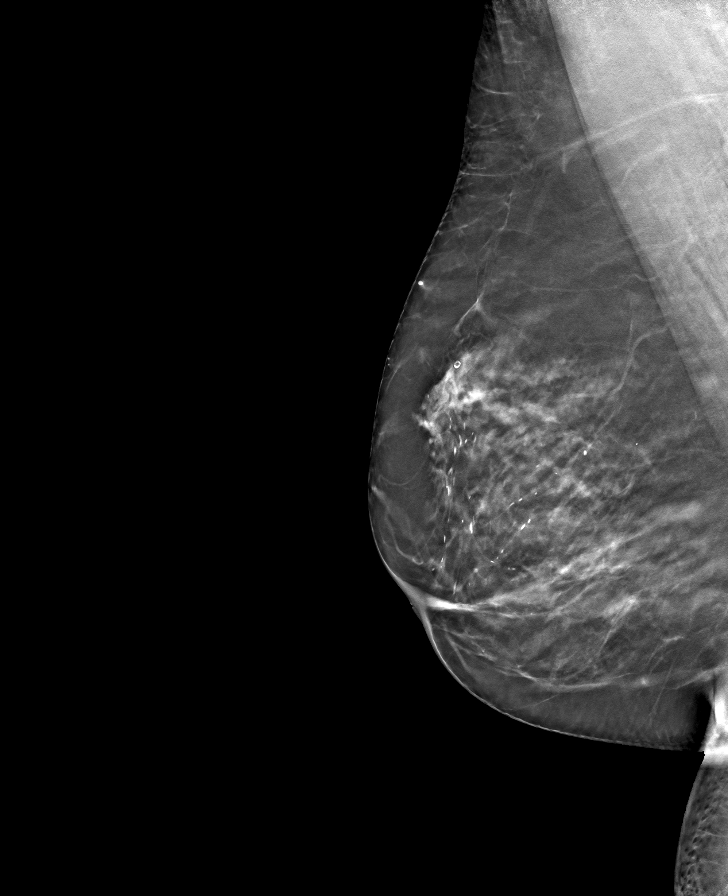

[R CC tomo · tomo slice 35/68.0]
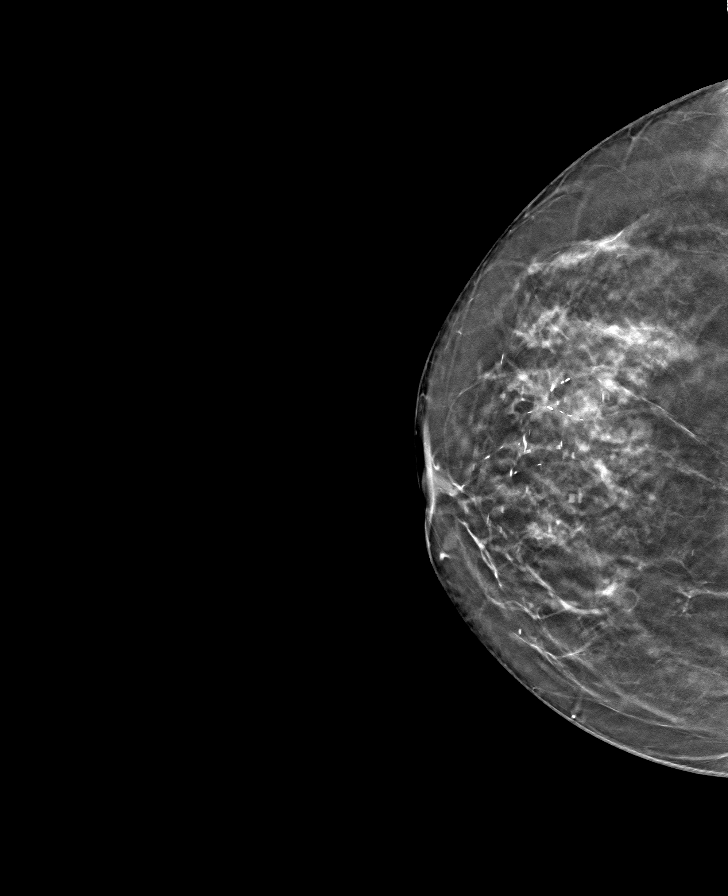

[8 of 24 positions shown; findings below may reference images not displayed]

ACR Breast Density Category c: The breast tissue is heterogeneously
dense, which may obscure small masses.
FINDINGS: There are no findings suspicious for malignancy.
IMPRESSION: No mammographic evidence of malignancy. A result letter of this
screening mammogram will be mailed directly to the patient.

RECOMMENDATION:
Screening mammogram in one year. (Code:Q3-W-BC3)

BI-RADS CATEGORY  1: Negative.

## 2023-11-30 ENCOUNTER — Ambulatory Visit: Payer: Medicare Other

## 2023-11-30 VITALS — Ht 67.0 in | Wt 194.0 lb

## 2023-11-30 DIAGNOSIS — Z0000 Encounter for general adult medical examination without abnormal findings: Secondary | ICD-10-CM

## 2023-11-30 NOTE — Patient Instructions (Signed)
Catherine [name] , Thank you for taking time to come for your Medicare Wellness Visit. I appreciate your ongoing commitment to your health goals. Please review the following plan we discussed and let me know if I can assist you in the future.   Referrals/Orders/Follow-Ups/Clinician Recommendations: Aim for 30 minutes of exercise or brisk walking, 6-8 glasses of water, and 5 servings of fruits and vegetables each day.  This is a list of the screening recommended for you and due dates:  Health Maintenance  Topic Date Due   COVID-19 Vaccine (1) Never done   Zoster (Shingles) Vaccine (1 of 2) Never done   Pneumonia Vaccine (2 of 2 - PPSV23 or PCV20) 06/20/2022   Flu Shot  Never done   Colon Cancer Screening  02/05/2024*   Mammogram  04/05/2024   Medicare Annual Wellness Visit  11/29/2024   DTaP/Tdap/Td vaccine (2 - Td or Tdap) 05/07/2031   DEXA scan (bone density measurement)  Completed   Hepatitis C Screening  Completed   HPV Vaccine  Aged Out  *Topic was postponed. The date shown is not the original due date.    Advanced directives: (ACP Link)Information on Advanced Care Planning can be found at [facility] Advance Health Care Directives Advance Health Care Directives ([email or web address]   Next Medicare Annual Wellness Visit scheduled for next year: Yes

## 2023-11-30 NOTE — Progress Notes (Signed)
Subjective:   Catherine [name] is a 67 y.o. female who presents for Medicare Annual (Subsequent) preventive examination.  Visit Complete: Virtual I connected with  Catherine [name] on 11/30/23 by a audio enabled telemedicine application and verified that I am speaking with the correct person using two identifiers.  Patient Location: Home  Provider Location: Home Office  I discussed the limitations of evaluation and management by telemedicine. The patient expressed understanding and agreed to proceed.  Vital Signs: Because this visit was a virtual/telehealth visit, some criteria may be missing or patient reported. Any vitals not documented were not able to be obtained and vitals that have been documented are patient reported.  Cardiac Risk Factors include: advanced age (>32men, >61 women)     Objective:    Today's Vitals   11/30/23 1507  Weight: 194 lb (88 kg)  Height: 5\' 7"  (1.702 m)   Body mass index is 30.38 kg/m.     11/30/2023    3:21 PM 11/27/2022   10:38 AM 11/26/2021   10:19 AM  Advanced Directives  Does Patient Have a Medical Advance Directive? No No No  Would patient like information on creating a medical advance directive? Yes (MAU/Ambulatory/Procedural Areas - Information given) No - Patient declined No - Patient declined    Current Medications (verified) Outpatient Encounter Medications as of 11/30/2023  Medication Sig   busPIRone (BUSPAR) 5 MG tablet Take 1 tablet (5 mg total) by mouth 3 (three) times daily as needed.   cyclobenzaprine (FLEXERIL) 10 MG tablet Take 1 tablet (10 mg total) by mouth 3 (three) times daily as needed for muscle spasms.   DULoxetine (CYMBALTA) 60 MG capsule Take 1 capsule (60 mg total) by mouth daily. (NEEDS TO BE SEEN BEFORE NEXT REFILL)   rosuvastatin (CRESTOR) 10 MG tablet Take 1 tablet (10 mg total) by mouth daily.   traZODone (DESYREL) 50 MG tablet Take 1 tablet (50 mg total) by mouth at bedtime as needed. for sleep   No  facility-administered encounter medications on file as of 11/30/2023.    Allergies (verified) Penicillins   History: Past Medical History:  Diagnosis Date   Depression    Past Surgical History:  Procedure Laterality Date   Endometrial ablation 07-Dec-1993     SPINE SURGERY     TUBAL LIGATION  12/14/1986   Family History  Problem Relation Age of Onset   Diabetes Mother    Hypertension Mother    Cancer Father        lung   Hypertension Father    Social History   Socioeconomic History   Marital status: Widowed    Spouse name: Not on file   Number of children: 2   Years of education: Not on file   Highest education level: Not on file  Occupational History   Occupation: Retail banker  Tobacco Use   Smoking status: Never   Smokeless tobacco: Never  Vaping Use   Vaping status: Never Used  Substance and Sexual Activity   Alcohol use: Not on file   Drug use: Never   Sexual activity: Not on file  Other Topics Concern   Not on file  Social History Narrative   Plans to move in with her daughter in [place] in the next year   Husband passed away in late 2021-12-07   Social Drivers of Health   Financial Resource Strain: Low Risk  (11/30/2023)   Overall Financial Resource Strain (CARDIA)    Difficulty of Paying Living Expenses: Not hard  at all  Food Insecurity: No Food Insecurity (11/30/2023)   Hunger Vital Sign    Worried About Running Out of Food in the Last Year: Never true    Ran Out of Food in the Last Year: Never true  Transportation Needs: No Transportation Needs (11/30/2023)   PRAPARE - Administrator, Civil Service (Medical): No    Lack of Transportation (Non-Medical): No  Physical Activity: Sufficiently Active (11/30/2023)   Exercise Vital Sign    Days of Exercise per Week: 5 days    Minutes of Exercise per Session: 30 min  Stress: No Stress Concern Present (11/30/2023)   [employer] of Occupational Health - Occupational Stress Questionnaire     Feeling of Stress : Not at all  Social Connections: Moderately Isolated (11/30/2023)   Social Connection and Isolation Panel [NHANES]    Frequency of Communication with Friends and Family: More than three times a week    Frequency of Social Gatherings with Friends and Family: Three times a week    Attends Religious Services: Never    Active Member of Clubs or Organizations: Yes    Attends Banker Meetings: 1 to 4 times per year    Marital Status: Widowed    Tobacco Counseling Counseling given: Not Answered   Clinical Intake:  Pre-visit preparation completed: Yes  Pain : No/denies pain     Diabetes: No  How often do you need to have someone help you when you read instructions, pamphlets, or other written materials from your doctor or pharmacy?: 1 - Never  Interpreter Needed?: No  Information entered by :: Kandis Fantasia LPN   Activities of Daily Living    11/30/2023    3:20 PM  In your present state of health, do you have any difficulty performing the following activities:  Hearing? 0  Vision? 0  Difficulty concentrating or making decisions? 0  Walking or climbing stairs? 0  Dressing or bathing? 0  Doing errands, shopping? 0  Preparing Food and eating ? N  Using the Toilet? N  In the past six months, have you accidently leaked urine? N  Do you have problems with loss of bowel control? N  Managing your Medications? N  Managing your Finances? N  Housekeeping or managing your Housekeeping? N    Patient Care Team: Junie Spencer, FNP as PCP - General (Family Medicine)  Indicate any recent Medical Services you may have received from other than [facility] providers in the past year (date may be approximate).     Assessment:   This is a routine wellness examination for Catherine [name].  Hearing/Vision screen Hearing Screening - Comments:: Denies hearing difficulties   Vision Screening - Comments:: No vision problems; will schedule routine eye exam soon      Goals Addressed             This Visit's Progress    Remain active and independent        Depression Screen    11/30/2023    3:19 PM 02/04/2023   11:36 AM 11/27/2022   10:36 AM 07/28/2022   10:32 AM 06/29/2022    4:14 PM 11/26/2021   10:17 AM 09/18/2021    8:25 AM  PHQ 2/9 Scores  PHQ - 2 Score 0 0 0 2 3 2  0  PHQ- 9 Score  0  3 5 3 1     Fall Risk    11/30/2023    3:20 PM 02/04/2023   11:36 AM 11/27/2022  10:35 AM 07/28/2022   10:31 AM 06/29/2022    3:33 PM  Fall Risk   Falls in the past year? 0 0 0 0 0  Number falls in past yr: 0  0    Injury with Fall? 0  0    Risk for fall due to : No Fall Risks  No Fall Risks    Follow up Falls prevention discussed;Education provided;Falls evaluation completed  Falls prevention discussed      MEDICARE RISK AT HOME: Medicare Risk at Home Any stairs in or around the home?: No If so, are there any without handrails?: No Home free of loose throw rugs in walkways, pet beds, electrical cords, etc?: Yes Adequate lighting in your home to reduce risk of falls?: Yes Life alert?: No Use of a cane, walker or w/c?: No Grab bars in the bathroom?: Yes Shower chair or bench in shower?: No Elevated toilet seat or a handicapped toilet?: Yes  TIMED UP AND GO:  Was the test performed?  No    Cognitive Function:        11/30/2023    3:20 PM 11/27/2022   10:38 AM 11/26/2021   10:20 AM  6CIT Screen  What Year? 0 points 0 points 0 points  What month? 0 points 0 points 0 points  What time? 0 points 0 points 0 points  Count back from 20 0 points 0 points 0 points  Months in reverse 0 points 0 points 0 points  Repeat phrase 0 points 0 points 2 points  Total Score 0 points 0 points 2 points    Immunizations Immunization History  Administered Date(s) Administered   Pneumococcal Conjugate-13 06/20/2021   Tdap 05/06/2021    TDAP status: Up to date  Flu Vaccine status: Declined, Education has been provided regarding the importance of  this vaccine but patient still declined. Advised may receive this vaccine at local pharmacy or Health Dept. Aware to provide a copy of the vaccination record if obtained from local pharmacy or Health Dept. Verbalized acceptance and understanding.  Pneumococcal vaccine status: Declined,  Education has been provided regarding the importance of this vaccine but patient still declined. Advised may receive this vaccine at local pharmacy or Health Dept. Aware to provide a copy of the vaccination record if obtained from local pharmacy or Health Dept. Verbalized acceptance and understanding.   Covid-19 vaccine status: Declined, Education has been provided regarding the importance of this vaccine but patient still declined. Advised may receive this vaccine at local pharmacy or Health Dept.or vaccine clinic. Aware to provide a copy of the vaccination record if obtained from local pharmacy or Health Dept. Verbalized acceptance and understanding.  Qualifies for Shingles Vaccine? Yes   Zostavax completed No   Shingrix Completed?: No.    Education has been provided regarding the importance of this vaccine. Patient has been advised to call insurance company to determine out of pocket expense if they have not yet received this vaccine. Advised may also receive vaccine at local pharmacy or Health Dept. Verbalized acceptance and understanding.  Screening Tests Health Maintenance  Topic Date Due   COVID-19 Vaccine (1) Never done   Zoster Vaccines- Shingrix (1 of 2) Never done   Pneumonia Vaccine 37+ Years old (2 of 2 - PPSV23 or PCV20) 06/20/2022   INFLUENZA VACCINE  Never done   Colonoscopy  02/05/2024 (Originally 10/13/1999)   MAMMOGRAM  04/05/2024   Medicare Annual Wellness (AWV)  11/29/2024   DTaP/Tdap/Td (2 - Td or Tdap) 05/07/2031  DEXA SCAN  Completed   Hepatitis C Screening  Completed   HPV VACCINES  Aged Out    Health Maintenance  Health Maintenance Due  Topic Date Due   COVID-19 Vaccine (1)  Never done   Zoster Vaccines- Shingrix (1 of 2) Never done   Pneumonia Vaccine 12+ Years old (2 of 2 - PPSV23 or PCV20) 06/20/2022   INFLUENZA VACCINE  Never done    Colorectal cancer screening:  Patient declines at this time   Mammogram status: Completed 04/06/23. Repeat every year  Bone Density status: Completed 06/20/21. Results reflect: Bone density results: OSTEOPENIA. Repeat every 2-3 years.  Lung Cancer Screening: (Low Dose CT Chest recommended if Age 73-80 years, 20 pack-year currently smoking OR have quit w/in 15years.) does not qualify.   Lung Cancer Screening Referral: n/a  Additional Screening:  Hepatitis C Screening: does qualify; Completed 06/20/21  Vision Screening: Recommended annual ophthalmology exams for early detection of glaucoma and other disorders of the eye. Is the patient up to date with their annual eye exam?  No  Who is the provider or what is the name of the office in which the patient attends annual eye exams? none If pt is not established with a provider, would they like to be referred to a provider to establish care? No .   Dental Screening: Recommended annual dental exams for proper oral hygiene  Community Resource Referral / Chronic Care Management: CRR required this visit?  No   CCM required this visit?  No     Plan:     I have personally reviewed and noted the following in the patient's chart:   Medical and social history Use of alcohol, tobacco or illicit drugs  Current medications and supplements including opioid prescriptions. Patient is not currently taking opioid prescriptions. Functional ability and status Nutritional status Physical activity Advanced directives List of other physicians Hospitalizations, surgeries, and ER visits in previous 12 months Vitals Screenings to include cognitive, depression, and falls Referrals and appointments  In addition, I have reviewed and discussed with patient certain preventive protocols,  quality metrics, and best practice recommendations. A written personalized care plan for preventive services as well as general preventive health recommendations were provided to patient.     Kandis Fantasia [place], [place]   [id number]   After Visit Summary: (MyChart) Due to this being a telephonic visit, the after visit summary with patients personalized plan was offered to patient via MyChart   Nurse Notes: See routing comments with patient concern

## 2023-12-03 ENCOUNTER — Telehealth: Payer: Self-pay | Admitting: Family Medicine

## 2023-12-03 NOTE — Telephone Encounter (Signed)
-----   Message from [place] sent at 12/02/2023 12:45 PM EST ----- Please tell patient to use humidifier and saline gel to help keep membranes moist. Avoid blowing nose.   Jannifer Rodney, FNP ----- Message ----- From: Anthoney Harada, LPN Sent: [id number]/42/7062   3:24 PM EST To: Junie Spencer, FNP  Patient states that she has been getting intermittent nosebleeds for over a year.  Wanted to hold on scheduling office visit but would like to know if there is any advice.

## 2023-12-03 NOTE — Telephone Encounter (Signed)
Patient aware.

## 2024-02-03 ENCOUNTER — Other Ambulatory Visit: Payer: Self-pay | Admitting: Family

## 2024-02-03 DIAGNOSIS — E785 Hyperlipidemia, unspecified: Secondary | ICD-10-CM

## 2024-02-07 ENCOUNTER — Other Ambulatory Visit: Payer: Self-pay | Admitting: Family

## 2024-02-07 ENCOUNTER — Encounter: Payer: Self-pay | Admitting: Family

## 2024-02-07 DIAGNOSIS — F320 Major depressive disorder, single episode, mild: Secondary | ICD-10-CM

## 2024-02-07 DIAGNOSIS — F411 Generalized anxiety disorder: Secondary | ICD-10-CM

## 2024-02-07 NOTE — Telephone Encounter (Signed)
LMTCB TO SCHEDULE APPT Letter mailed 

## 2024-02-07 NOTE — Telephone Encounter (Signed)
  [facility] NTBS last OV 02/04/23 NO RF sent to pharmacy last OV greater than a year

## 2024-02-13 ENCOUNTER — Other Ambulatory Visit: Payer: Self-pay | Admitting: Family

## 2024-02-13 DIAGNOSIS — F320 Major depressive disorder, single episode, mild: Secondary | ICD-10-CM

## 2024-02-13 DIAGNOSIS — F411 Generalized anxiety disorder: Secondary | ICD-10-CM

## 2024-02-14 ENCOUNTER — Encounter: Payer: Self-pay | Admitting: Family

## 2024-02-14 NOTE — Telephone Encounter (Signed)
 [place] NTBS Last OV 02/04/23 NO RF sent to pharmacy last OV greater than a year

## 2024-02-14 NOTE — Telephone Encounter (Signed)
Lmtcb to schedule appt Letter mailed

## 2024-03-04 DIAGNOSIS — B379 Candidiasis, unspecified: Secondary | ICD-10-CM | POA: Diagnosis not present

## 2024-03-04 DIAGNOSIS — R300 Dysuria: Secondary | ICD-10-CM | POA: Diagnosis not present

## 2024-03-04 DIAGNOSIS — T3695XA Adverse effect of unspecified systemic antibiotic, initial encounter: Secondary | ICD-10-CM | POA: Diagnosis not present

## 2024-03-04 DIAGNOSIS — B9689 Other specified bacterial agents as the cause of diseases classified elsewhere: Secondary | ICD-10-CM | POA: Diagnosis not present

## 2024-03-04 DIAGNOSIS — N3001 Acute cystitis with hematuria: Secondary | ICD-10-CM | POA: Diagnosis not present

## 2024-03-23 ENCOUNTER — Other Ambulatory Visit: Payer: Self-pay | Admitting: Family Medicine

## 2024-03-23 DIAGNOSIS — F411 Generalized anxiety disorder: Secondary | ICD-10-CM

## 2024-03-23 DIAGNOSIS — F320 Major depressive disorder, single episode, mild: Secondary | ICD-10-CM

## 2024-03-23 DIAGNOSIS — F5101 Primary insomnia: Secondary | ICD-10-CM

## 2024-03-23 MED ORDER — BUSPIRONE HCL 5 MG PO TABS: 5.0000 mg | ORAL_TABLET | Freq: Three times a day (TID) | ORAL | 0 refills | Status: DC | PRN

## 2024-03-23 MED ORDER — DULOXETINE HCL 60 MG PO CPEP: 60.0000 mg | ORAL_CAPSULE | Freq: Every day | ORAL | 0 refills | Status: DC

## 2024-03-23 MED ORDER — TRAZODONE HCL 50 MG PO TABS: 50.0000 mg | ORAL_TABLET | Freq: Every evening | ORAL | 0 refills | Status: AC | PRN

## 2024-03-24 ENCOUNTER — Ambulatory Visit: Admitting: Family Medicine

## 2024-04-06 ENCOUNTER — Encounter: Payer: Self-pay | Admitting: Family

## 2024-04-06 ENCOUNTER — Ambulatory Visit (INDEPENDENT_AMBULATORY_CARE_PROVIDER_SITE_OTHER): Admitting: Family

## 2024-04-06 VITALS — BP 130/77 | HR 84 | Temp 97.3°F | Ht 67.0 in | Wt 204.4 lb

## 2024-04-06 DIAGNOSIS — K219 Gastro-esophageal reflux disease without esophagitis: Secondary | ICD-10-CM | POA: Insufficient documentation

## 2024-04-06 DIAGNOSIS — F320 Major depressive disorder, single episode, mild: Secondary | ICD-10-CM

## 2024-04-06 DIAGNOSIS — F411 Generalized anxiety disorder: Secondary | ICD-10-CM

## 2024-04-06 DIAGNOSIS — F5101 Primary insomnia: Secondary | ICD-10-CM

## 2024-04-06 DIAGNOSIS — Z0000 Encounter for general adult medical examination without abnormal findings: Secondary | ICD-10-CM | POA: Diagnosis not present

## 2024-04-06 DIAGNOSIS — E785 Hyperlipidemia, unspecified: Secondary | ICD-10-CM

## 2024-04-06 DIAGNOSIS — Z0001 Encounter for general adult medical examination with abnormal findings: Secondary | ICD-10-CM

## 2024-04-06 DIAGNOSIS — M25552 Pain in left hip: Secondary | ICD-10-CM | POA: Diagnosis not present

## 2024-04-06 DIAGNOSIS — L570 Actinic keratosis: Secondary | ICD-10-CM

## 2024-04-06 DIAGNOSIS — N3946 Mixed incontinence: Secondary | ICD-10-CM

## 2024-04-06 DIAGNOSIS — R7309 Other abnormal glucose: Secondary | ICD-10-CM | POA: Diagnosis not present

## 2024-04-06 LAB — LIPID PANEL

## 2024-04-06 MED ORDER — BUSPIRONE HCL 5 MG PO TABS: 5.0000 mg | ORAL_TABLET | Freq: Three times a day (TID) | ORAL | 2 refills | Status: AC | PRN

## 2024-04-06 MED ORDER — OMEPRAZOLE 20 MG PO CPDR: 20.0000 mg | DELAYED_RELEASE_CAPSULE | Freq: Every day | ORAL | 1 refills | Status: AC

## 2024-04-06 MED ORDER — ROSUVASTATIN CALCIUM 10 MG PO TABS: 10.0000 mg | ORAL_TABLET | Freq: Every day | ORAL | 0 refills | Status: AC

## 2024-04-06 MED ORDER — DULOXETINE HCL 60 MG PO CPEP: 60.0000 mg | ORAL_CAPSULE | Freq: Every day | ORAL | 3 refills | Status: AC

## 2024-04-06 MED ORDER — TRAZODONE HCL 100 MG PO TABS: 100.0000 mg | ORAL_TABLET | Freq: Every day | ORAL | 3 refills | Status: AC

## 2024-04-06 NOTE — Patient Instructions (Signed)
 GERD in Adults: What to Know  Gastroesophageal reflux (GER) is when acid from your stomach flows up into your esophagus. Your esophagus is the part of your body that moves food from your mouth to your stomach. Normally, food goes down and stays in your stomach to be digested. But with GER, food and stomach acid may go back up. You may have a disease called gastroesophageal reflux disease (GERD) if the reflux: Happens often. Causes very bad symptoms. Makes your esophagus sore and swollen. Over time, GERD can make small holes called ulcers in the lining of your esophagus. What are the causes? GERD is caused by a problem with the muscle between your esophagus and stomach. This muscle is called the lower esophageal sphincter (LES). When it's weak or not normal, it doesn't close like it should. This means food and stomach acid can go back up into your esophagus. The muscle can be weak if: You smoke or use products with tobacco in them. You're pregnant. You have a type of hernia called a hiatal hernia. You eat certain foods and drinks. These include: Alcohol. Coffee. Chocolate. Onions. Peppermint. What increases the risk? Being overweight. Having a disease that affects your connective tissue. Taking NSAIDs, such as ibuprofen. What are the signs or symptoms? Heartburn. Trouble swallowing. Pain when you swallow. The feeling of having a lump in your throat. A bitter taste in your mouth. Bad breath. Having an upset or bloated stomach. Burping. Chest pain. Other conditions can also cause chest pain. Make sure you see your health care provider if you have chest pain. Wheezing. This is when you make high-pitched whistling sounds when you breathe, most often when you breathe out. A long-term cough or a cough at night. How is this diagnosed? GERD may be diagnosed based on your medical history and a physical exam. You may also have tests. These may include: An endoscopy. This test looks at your  stomach and esophagus with a small camera. A barium swallow test. This shows the shape and size of your esophagus and how well it's working. Tests of your esophagus to check for: Acid levels. Pressure. How is this treated? Treatment may depend on how bad your symptoms are. It may include: Changes to your diet and daily life. Medicines. Surgery. Follow these instructions at home: Eating and drinking Follow an eating plan as told by your provider. You may need to avoid certain foods and drinks. These may include: Coffee and tea, with or without caffeine. Alcohol. Energy drinks and sports drinks. Fizzy drinks or sodas. Chocolate and cocoa. Peppermint and mint flavorings. Garlic and onions. Horseradish. Spicy and acidic foods. These include: Peppers. Chili powder and curry powder. Vinegar. Hot sauces and BBQ sauce. Citrus fruits and juices. These include: Oranges. Lemons. Limes. Tomato-based foods. These include: Red sauce and pizza with red sauce. Chili. Salsa. Fried and fatty foods. These include: Donuts. [place] fries. Potato chips. High-fat dressings. High-fat meats. These include: Hot dogs and sausage. Rib eye steak. Ham and bacon. High-fat dairy items. These include: Whole milk. Butter. Cream cheese. Eat small meals often. Avoid eating big meals. Avoid drinking lots of liquid with your meals. Try not to eat meals during the 2-3 hours before bedtime. Try not to lie down right after you eat. Do not exercise right after you eat. Lifestyle  If you're overweight, lose an amount of weight that's healthy for you. Ask your provider about a safe weight loss goal. Do not smoke, vape, or use nicotine or tobacco. Wear  loose clothes. Do not wear things that are tight around your waist. When you sleep, try: Raising the head of your bed about 6 inches (15 cm). You can use a wedge to do this. Lying down on your left side. Try to lower your stress. If you need help doing  this, ask your provider. General instructions Take your medicines only as told. Do not take aspirin or ibuprofen unless you're told to. Watch for any changes in your symptoms. Do not bend over if it makes your symptoms worse. Contact a health care provider if: You have new symptoms. You have trouble: Drinking. Swallowing. Eating. It hurts to swallow. You have wheezing. You have a cough that won't go away. Your voice is hoarse. Your symptoms don't get better with treatment. Get help right away if: You have pain all of a sudden in your: Arm. Neck. Jaw. Teeth. Back. You feel sweaty, dizzy, or light-headed all of a sudden. You faint. You have chest pain or shortness of breath. You vomit and the vomit is: Green, yellow, or black. Looks like blood or coffee grounds. Your poop is red, bloody, or black. These symptoms may be an emergency. Call 911 right away. Do not wait to see if the symptoms will go away. Do not drive yourself to the hospital. This information is not intended to replace advice given to you by your health care provider. Make sure you discuss any questions you have with your health care provider.  Actinic Keratosis An actinic keratosis is a precancerous growth on the skin. If there is more than one, the condition is called actinic keratoses. These growths appear most often on parts of the skin that get a lot of sun exposure, including the: Scalp. Face. Ears. Lips. Upper back. Forearms. Backs of the hands. If left untreated, these growths may develop into a skin cancer called squamous cell carcinoma. It is important to have all these growths checked by a health care provider to determine the best treatment. What are the causes? Actinic keratoses are caused by getting too much ultraviolet (UV) radiation from the sun or other UV light sources. What increases the risk? You are more likely to develop this condition if you: Have light-colored skin or blue  eyes. Have blond or red hair. Spend a lot of time in the sun. Do not protect your skin from the sun when outdoors. Are an older person. The risk of developing an actinic keratosis increases with age. What are the signs or symptoms? These growths feel like scaly, rough spots of skin. Symptoms of this condition include growths that may: Be as small as a pinhead or as big as a quarter. Itch, hurt, or feel sensitive. Be skin-colored, light tan, dark tan, pink, or a combination of these colors. In most cases, the growths become red. Have a small piece of pink or gray skin (skin tag) growing from them. It may be easier to notice the growths by feeling them rather than seeing them. Sometimes, actinic keratoses disappear but may return a few days to a few weeks later. How is this diagnosed? This condition is usually diagnosed with a physical exam. A tissue sample may be removed from the growth and examined under a microscope (biopsy). How is this treated? This condition may be treated by: Scraping off the actinic keratosis (curettage). Freezing the actinic keratosis with liquid nitrogen (cryosurgery). This causes the growth to eventually fall off. Applying medicated creams or gels to destroy the cells in the growth. Applying chemicals  to the growth to make the outer layers of skin peel off (chemical peel). Using photodynamic therapy. In this procedure, medicated cream is applied to the actinic keratosis. This cream increases your skin's sensitivity to light. Then, a strong light is aimed at the actinic keratosis to destroy cells in the growth. Follow these instructions at home: Skin care Apply cool, wet cloths (coolcompresses) to the affected areas. Do not scratch your skin. Check your skin regularly for any growths, especially ones that: Start to itch or bleed. Change in size, shape, or color. Caring for the treated area Keep the treated area clean and dry as told by your health care  provider. Do not apply any medicine, cream, or lotion to the treated area unless your health care provider tells you to do that. Do not pick at blisters or try to break them open. This can cause infection and scarring. If you have red or irritated skin after treatment, follow instructions from your health care provider about how to take care of the treated area. Make sure you: Wash your hands with soap and water for at least 20 seconds before and after you change your bandage (dressing). If soap and water are not available, use hand sanitizer. Change your dressing as told by your health care provider. If you have red or irritated skin after treatment, check the treated area every day for signs of infection. Check for: Redness, swelling, or pain. Fluid or blood. Warmth. Pus or a bad smell. Lifestyle Do not use any products that contain nicotine or tobacco. These products include cigarettes, chewing tobacco, and vaping devices, such as e-cigarettes. If you need help quitting, ask your health care provider. Take steps to protect your skin from the sun, such as: Avoiding the sun between 10:00 a.m. and 4:00 p.m. This is when the UV light is the strongest. Using a sunscreen or sunblock with SPF 30 or greater. Applying sunscreen before you are exposed to sunlight and reapplying as often as told by the instructions on the sunscreen container. Wearing protective gear, including: Sunglasses with UV protection. A hat and clothing that protect your skin from sunlight. Avoiding medicines that increase your sensitivity to sunlight when possible. Avoidingtanning beds and other indoor tanning devices. General instructions Take or apply over-the-counter and prescription medicines only as told by your health care provider. Return to your normal activities as told by your health care provider. Ask your health care provider what activities are safe for you. Have a skin exam done every year by a health care  provider who is a skin specialist (dermatologist). Keep all follow-up visits. Your health care provider will want to check that the site has healed after treatment. Contact a health care provider if: You notice any changes or new growths on your skin. You have swelling, pain, or redness around your treated area. You have fluid or blood coming from your treated area. Your treated area feels warm to the touch. You have pus or a bad smell coming from your treated area. You have a fever or chills. You have a blister that becomes large and painful. Summary An actinic keratosis is a precancerous growth on the skin.If left untreated, these growths can develop into skin cancer. Check your skin regularly for any growths, especially growths that start to itch or bleed, or change in size, shape, or color. Take steps to protect your skin from the sun. Contact a health care provider if you notice any changes or new growths on your skin. This  information is not intended to replace advice given to you by your health care provider. Make sure you discuss any questions you have with your health care provider. Document Revised: 02/12/2022 Document Reviewed: 02/12/2022 Elsevier Patient Education  2024 [employer] Document Revised: 10/12/2023 Document Reviewed: 04/28/2023 Elsevier Patient Education  2024 [employer].

## 2024-04-06 NOTE — Progress Notes (Signed)
 Subjective:    Patient ID: Catherine [name], female    DOB: 05-09-1956, 68 y.o.   MRN: [id number]  Chief Complaint  Patient presents with   Medical Management of Chronic Issues    BLADDER LEAKS, HEART BURN, LEFT HIP POPS OUT OF PLACE,DISCUSS WEIGHT, RIGHT SIDE OF NOSE BLEED.   PT presents to the office today CPE.   Complaining of mixed incontinence that she noticed several months ago. States this is unchanged. Does not wear a pad, but has to change her underwear once a week.   Complaining of left hip pain that feels like it "pops" out of joint when she stands. After it "pops" back in she has instant relief.  Insomnia Primary symptoms: difficulty falling asleep, frequent awakening.   The current episode started more than one year. The onset quality is gradual. The problem occurs intermittently. The symptoms are relieved by medication. Past treatments include medication. The treatment provided mild relief. PMH includes: depression.   Anxiety Presents for follow-up visit. Symptoms include insomnia. Patient reports no excessive worry, irritability, malaise, nervous/anxious behavior or restlessness. Symptoms occur occasionally. The severity of symptoms is mild.    Depression        This is a chronic problem.  The current episode started more than 1 year ago.   The problem occurs intermittently.  Associated symptoms include insomnia and sad.  Associated symptoms include no helplessness, no hopelessness and no restlessness.  Past treatments include SNRIs - Serotonin and norepinephrine reuptake inhibitors.  Past medical history includes anxiety.   Back Pain This is a chronic problem. The current episode started more than 1 year ago. The problem occurs intermittently. The problem has been waxing and waning since onset. The pain is present in the lumbar spine. The quality of the pain is described as aching. The pain is at a severity of 3/10. The pain is mild. Risk factors include obesity. She has tried  home exercises for the symptoms. The treatment provided mild relief.  Gastroesophageal Reflux She complains of belching and heartburn. This is a new problem. The current episode started more than 1 year ago. The problem occurs occasionally. Risk factors include obesity. She has tried an antacid for the symptoms. The treatment provided mild relief.      Review of Systems  Constitutional:  Negative for irritability.  Gastrointestinal:  Positive for heartburn.  Musculoskeletal:  Positive for back pain.  Psychiatric/Behavioral:  Positive for depression. The patient has insomnia. The patient is not nervous/anxious.   All other systems reviewed and are negative.      Objective:   Physical Exam Vitals reviewed.  Constitutional:      General: She is not in acute distress.    Appearance: She is well-developed. She is obese.  HENT:     Head: Normocephalic and atraumatic.     Right Ear: Tympanic membrane normal.     Left Ear: Tympanic membrane normal.  Eyes:     Pupils: Pupils are equal, round, and reactive to light.  Neck:     Thyroid : No thyromegaly.  Cardiovascular:     Rate and Rhythm: Normal rate and regular rhythm.     Heart sounds: Normal heart sounds. No murmur heard. Pulmonary:     Effort: Pulmonary effort is normal. No respiratory distress.     Breath sounds: Normal breath sounds. No wheezing.  Abdominal:     General: Bowel sounds are normal. There is no distension.     Palpations: Abdomen is soft.  Tenderness: There is no abdominal tenderness.  Musculoskeletal:        General: No tenderness. Normal range of motion.     Cervical back: Normal range of motion and neck supple.  Skin:    General: Skin is warm and dry.     Findings: Lesion present.          Comments: Small scaly skin lesion on scalp and right upper thoracic area  Neurological:     Mental Status: She is alert and oriented to person, place, and time.     Cranial Nerves: No cranial nerve deficit.      Deep Tendon Reflexes: Reflexes are normal and symmetric.  Psychiatric:        Behavior: Behavior normal.        Thought Content: Thought content normal.        Judgment: Judgment normal.     Area cleaned and Two skin tags removed on left neck. Pt tolerated well.  BP 130/77   Pulse 84   Temp (!) 97.3 F (36.3 C) (Temporal)   Ht 5\' 7"  (1.702 m)   Wt 204 lb 6.4 oz (92.7 kg)   SpO2 94%   BMI 32.01 kg/m      Assessment & Plan:  [place] Groman comes in today with chief complaint of Medical Management of Chronic Issues (BLADDER LEAKS, HEART BURN, LEFT HIP POPS OUT OF PLACE,DISCUSS WEIGHT, RIGHT SIDE OF NOSE BLEED.)   Diagnosis and orders addressed:  1. GAD (generalized anxiety disorder) - busPIRone  (BUSPAR ) 5 MG tablet; Take 1 tablet (5 mg total) by mouth 3 (three) times daily as needed.  Dispense: 90 tablet; Refill: 2 - DULoxetine  (CYMBALTA ) 60 MG capsule; Take 1 capsule (60 mg total) by mouth daily. (NEEDS TO BE SEEN BEFORE NEXT REFILL)  Dispense: 90 capsule; Refill: 3 - CMP14+EGFR - CBC with Differential/Platelet  2. Depression, major, single episode, mild (HCC) - busPIRone  (BUSPAR ) 5 MG tablet; Take 1 tablet (5 mg total) by mouth 3 (three) times daily as needed.  Dispense: 90 tablet; Refill: 2 - DULoxetine  (CYMBALTA ) 60 MG capsule; Take 1 capsule (60 mg total) by mouth daily. (NEEDS TO BE SEEN BEFORE NEXT REFILL)  Dispense: 90 capsule; Refill: 3 - CMP14+EGFR - CBC with Differential/Platelet  3. Hyperlipidemia, unspecified hyperlipidemia type - rosuvastatin  (CRESTOR ) 10 MG tablet; Take 1 tablet (10 mg total) by mouth daily.  Dispense: 90 tablet; Refill: 0 - CMP14+EGFR - CBC with Differential/Platelet - Lipid panel  4. Primary insomnia -Will increase Trazodone  to 100 mg from 50 mg  Sleep ritual  - traZODone  (DESYREL ) 100 MG tablet; Take 1 tablet (100 mg total) by mouth at bedtime.  Dispense: 90 tablet; Refill: 3 - CMP14+EGFR - CBC with Differential/Platelet  5. Annual  physical exam (Primary) - CMP14+EGFR - CBC with Differential/Platelet - Lipid panel  6. Gastroesophageal reflux disease without esophagitis Will start Prilosec 20 mg  -Diet discussed- Avoid fried, spicy, citrus foods, caffeine and alcohol -Do not eat 2-3 hours before bedtime -Encouraged small frequent meals -Avoid NSAID's - omeprazole  (PRILOSEC) 20 MG capsule; Take 1 capsule (20 mg total) by mouth daily.  Dispense: 90 capsule; Refill: 1 - CMP14+EGFR - CBC with Differential/Platelet  7. Mixed incontinence Recommend Kegal exercises   8. Actinic keratosis Recommend scheduling an appointment to have removed  9. Left hip pain Recommend ROM exercises   Labs pending Will increase Trazodone  to 100 mg from 50 mg  Start Prilosec 20 mg today Continue current medications  Health Maintenance reviewed Diet  and exercise encouraged  Follow up plan: 6 months    Tommas Fragmin, FNP

## 2024-04-07 ENCOUNTER — Telehealth: Payer: Self-pay

## 2024-04-07 LAB — CMP14+EGFR
ALT: 32 IU/L (ref 0–32)
AST: 30 IU/L (ref 0–40)
Albumin: 4.1 g/dL (ref 3.9–4.9)
Alkaline Phosphatase: 100 IU/L (ref 44–121)
BUN/Creatinine Ratio: 9 — ABNORMAL LOW (ref 12–28)
BUN: 8 mg/dL (ref 8–27)
Bilirubin Total: 1 mg/dL (ref 0.0–1.2)
CO2: 23 mmol/L (ref 20–29)
Calcium: 9.4 mg/dL (ref 8.7–10.3)
Chloride: 105 mmol/L (ref 96–106)
Creatinine, Ser: 0.89 mg/dL (ref 0.57–1.00)
Globulin, Total: 2.8 g/dL (ref 1.5–4.5)
Glucose: 120 mg/dL — ABNORMAL HIGH (ref 70–99)
Potassium: 4 mmol/L (ref 3.5–5.2)
Sodium: 142 mmol/L (ref 134–144)
Total Protein: 6.9 g/dL (ref 6.0–8.5)
eGFR: 70 mL/min/{1.73_m2} (ref >59)

## 2024-04-07 LAB — CBC WITH DIFFERENTIAL/PLATELET
Basophils Absolute: 0.1 10*3/uL (ref 0.0–0.2)
Basos: 1 %
EOS (ABSOLUTE): 0.3 10*3/uL (ref 0.0–0.4)
Eos: 5 %
Hematocrit: 42 % (ref 34.0–46.6)
Hemoglobin: 13.5 g/dL (ref 11.1–15.9)
Immature Grans (Abs): 0 10*3/uL (ref 0.0–0.1)
Immature Granulocytes: 1 %
Lymphocytes Absolute: 1.9 10*3/uL (ref 0.7–3.1)
Lymphs: 30 %
MCH: 29.3 pg (ref 26.6–33.0)
MCHC: 32.1 g/dL (ref 31.5–35.7)
MCV: 91 fL (ref 79–97)
Monocytes Absolute: 0.6 10*3/uL (ref 0.1–0.9)
Monocytes: 10 %
Neutrophils Absolute: 3.4 10*3/uL (ref 1.4–7.0)
Neutrophils: 53 %
Platelets: 276 10*3/uL (ref 150–450)
RBC: 4.6 x10E6/uL (ref 3.77–5.28)
RDW: 12.6 % (ref 11.7–15.4)
WBC: 6.3 10*3/uL (ref 3.4–10.8)

## 2024-04-07 LAB — LIPID PANEL
Cholesterol, Total: 143 mg/dL (ref 100–199)
HDL: 45 mg/dL (ref >39)
LDL CALC COMMENT:: 3.2 ratio (ref 0.0–4.4)
LDL Chol Calc (NIH): 76 mg/dL (ref 0–99)
Triglycerides: 125 mg/dL (ref 0–149)
VLDL Cholesterol Cal: 22 mg/dL (ref 5–40)

## 2024-04-07 MED ORDER — SCOPOLAMINE 1 MG/3DAYS TD PT72: 1.0000 | MEDICATED_PATCH | TRANSDERMAL | 0 refills | Status: AC

## 2024-04-07 NOTE — Telephone Encounter (Signed)
 Scopolamine patches sent in per MMM and pt aware.

## 2024-04-07 NOTE — Telephone Encounter (Signed)
 Copied from CRM [id number]. Topic: General - Other >> Apr 07, 2024 11:09 AM Emylou G wrote: Reason for CRM: Patient called.. would like sea sick patches for cruise?  Can you prescribed?  Her number on file is good.

## 2024-04-07 NOTE — Addendum Note (Signed)
 Addended by: Jomarion Mish G on: 04/07/2024 11:44 AM   Modules accepted: Orders

## 2024-04-12 LAB — SPECIMEN STATUS REPORT

## 2024-04-12 LAB — HGB A1C W/O EAG: Hgb A1c MFr Bld: 6.4 % — ABNORMAL HIGH (ref 4.8–5.6)

## 2024-04-24 ENCOUNTER — Other Ambulatory Visit: Payer: Self-pay | Admitting: Family Medicine

## 2024-04-24 DIAGNOSIS — R7303 Prediabetes: Secondary | ICD-10-CM

## 2024-04-27 ENCOUNTER — Telehealth: Payer: Self-pay

## 2024-04-27 NOTE — Progress Notes (Signed)
 Care Guide Pharmacy Note  04/27/2024 Name: Catherine [name] MRN: [id number] DOB: [birth year]  Referred By: Yevette Hem, FNP Reason for referral: Complex Care Management (Outreach to schedule with Pharm d )   Catherine [name] is a 68 y.o. year old female who is a primary care patient of Yevette Hem, FNP.  Catherine [name] was referred to the pharmacist for assistance related to: DMII  Successful contact was made with the patient to discuss pharmacy services including being ready for the pharmacist to call at least 5 minutes before the scheduled appointment time and to have medication bottles and any blood pressure readings ready for review. The patient agreed to meet with the pharmacist via telephone visit on (date/time).06/01/2024  Lenton Rail , RMA     [place]  [facility], [facility] Guide  Direct Dial: [phone]  Website: [email or web address]

## 2024-05-31 NOTE — Progress Notes (Signed)
 06/01/2024 Name: Catherine [name] MRN: [id number] DOB: Apr 14, 1956  Chief Complaint  Patient presents with   Prediabetes    Catherine [name] is a 68 y.o. year old female who presented for a telephone visit. I connected with  Anajulia Bistline on 06/01/24 by telephone and verified that I am speaking with the correct person using two identifiers. I discussed the limitations of evaluation and management by telemedicine. The patient expressed understanding and agreed to proceed.  Patient was located in her home and PharmD in PCP office during this visit.   They were referred to the pharmacist by their PCP for assistance in managing prediabetes.   Care Team: Primary Care Provider: Bari Learn, FNP; Next Scheduled Visit: 06/29/24  Medication Access/Adherence  Current Pharmacy:  [facility] [address], [place] - [address] [place] [address] Phone: [phone] Fax: [phone]  [facility] [address] [place], [place] - [address] [place] [address] Phone: [phone] Fax: [phone]   Patient reports affordability concerns with their medications: No  Patient reports access/transportation concerns to their pharmacy: No  Patient reports adherence concerns with their medications:  No    Subjective: Patient reports since elevated A1C she has cut back drastically on bread, sweets, and pasta. Also has started using a smaller plate so that she eats smaller portions overall. Notes that she has many family members with diabetes so she is trying to do what she can to prevent this.  Prediabetes:  Current medications: none  Current glucose readings: not checking  Current meal patterns:  - 2 meals/day - Lunch: sandwich or salad - Dinner: chicken, pasta but eats a much smaller portion, broccoli, carrots, cucumbers  - Breakfast:: granola bar, oranges - Drinks: mainly water, soda once in a while  Current physical activity: walking 3-4 miles  daily this past week while on vacation, but she keeps her 27 year old grandson 3-4 days per week which requires a lot of activity, tracks steps on her watch  Objective:  Lab Results  Component Value Date   HGBA1C 6.4 (H) 04/06/2024    Lab Results  Component Value Date   CREATININE 0.89 04/06/2024   BUN 8 04/06/2024   NA 142 04/06/2024   K 4.0 04/06/2024   CL 105 04/06/2024   CO2 23 04/06/2024    Lab Results  Component Value Date   CHOL 143 04/06/2024   HDL 45 04/06/2024   LDLCALC 76 04/06/2024   TRIG 125 04/06/2024   CHOLHDL 3.2 04/06/2024    Medications Reviewed Today     Reviewed by Bonnell Izetta SAUNDERS, RPH (Pharmacist) on 06/01/24 at 1602  Med List Status: <None>   Medication Order Taking? Sig Documenting Provider Last Dose Status Informant  busPIRone  (BUSPAR ) 5 MG tablet [id number]  Take 1 tablet (5 mg total) by mouth 3 (three) times daily as needed. Learn Bari A, FNP  Active   DULoxetine  (CYMBALTA ) 60 MG capsule [id number]  Take 1 capsule (60 mg total) by mouth daily. (NEEDS TO BE SEEN BEFORE NEXT REFILL) Learn Bari A, FNP  Active   omeprazole  (PRILOSEC) 20 MG capsule [id number]  Take 1 capsule (20 mg total) by mouth daily. Learn Bari A, FNP  Active   rosuvastatin  (CRESTOR ) 10 MG tablet [id number]  Take 1 tablet (10 mg total) by mouth daily. Learn Bari LABOR, FNP  Active   scopolamine  (TRANSDERM-SCOP) 1 MG/3DAYS [id number]  Place 1 patch (1.5 mg total) onto the skin every  3 (three) days. Lavell Lye A, FNP  Active   traZODone  (DESYREL ) 100 MG tablet [id number]  Take 1 tablet (100 mg total) by mouth at bedtime. Lavell Lye A, FNP  Active   traZODone  (DESYREL ) 50 MG tablet [id number] No Take 1 tablet (50 mg total) by mouth at bedtime as needed. for sleep Lavell Lye LABOR, FNP Taking Active               Assessment/Plan:   Prediabetes: - Last A1C 6.4% indicating prediabetes. She has made significant dietary changes since then. Reviewed my plate method.  Encouraged to increase intake of non-starchy veggies, protein, and limit intake of carb heavy foods. Encouraged her to increase physical activity to at least 150 minutes per week of moderate intensity aerobic exercise with goal of weight loss. - Reviewed long term cardiovascular and renal outcomes of uncontrolled blood sugar - Reviewed goal A1c, goal fasting, and goal 2 hour post prandial glucose - Recommend to recheck A1C at upcoming PCP visit  Follow Up Plan: PCP on 06/29/24  Izetta Henry, PharmD PGY-1 Pharmacy Resident  Mliss Tarry Griffin, PharmD, BCACP, CPP Clinical Pharmacist, [facility]

## 2024-06-01 ENCOUNTER — Other Ambulatory Visit

## 2024-06-01 DIAGNOSIS — R7303 Prediabetes: Secondary | ICD-10-CM

## 2024-06-29 ENCOUNTER — Ambulatory Visit: Admitting: Family

## 2024-07-27 ENCOUNTER — Other Ambulatory Visit: Payer: Self-pay | Admitting: Family

## 2024-07-27 DIAGNOSIS — F320 Major depressive disorder, single episode, mild: Secondary | ICD-10-CM

## 2024-07-27 DIAGNOSIS — F5101 Primary insomnia: Secondary | ICD-10-CM

## 2024-10-07 ENCOUNTER — Other Ambulatory Visit: Payer: Self-pay | Admitting: Family

## 2024-10-07 DIAGNOSIS — E785 Hyperlipidemia, unspecified: Secondary | ICD-10-CM

## 2024-11-01 ENCOUNTER — Other Ambulatory Visit: Payer: Self-pay | Admitting: Family

## 2024-11-01 DIAGNOSIS — F411 Generalized anxiety disorder: Secondary | ICD-10-CM

## 2024-11-01 DIAGNOSIS — F320 Major depressive disorder, single episode, mild: Secondary | ICD-10-CM
